# Patient Record
Sex: Male | Born: 1960 | Race: Black or African American | Hispanic: No | Marital: Married | Smoking: Former smoker
Health system: Southern US, Community
[De-identification: ages and names within clinical notes are randomized; demographics above are authoritative.]

## PROBLEM LIST (undated history)

## (undated) DIAGNOSIS — B1920 Unspecified viral hepatitis C without hepatic coma: Secondary | ICD-10-CM

## (undated) DIAGNOSIS — F1010 Alcohol abuse, uncomplicated: Secondary | ICD-10-CM

## (undated) DIAGNOSIS — F1910 Other psychoactive substance abuse, uncomplicated: Secondary | ICD-10-CM

## (undated) HISTORY — DX: Alcohol abuse, uncomplicated: F10.10

## (undated) HISTORY — PX: COLONOSCOPY: SHX174

## (undated) HISTORY — PX: LIVER BIOPSY: SHX301

## (undated) HISTORY — DX: Other psychoactive substance abuse, uncomplicated: F19.10

## (undated) HISTORY — DX: Unspecified viral hepatitis C without hepatic coma: B19.20

---

## 1998-07-15 ENCOUNTER — Emergency Department (HOSPITAL_COMMUNITY): Admission: EM | Admit: 1998-07-15 | Discharge: 1998-07-15 | Payer: Self-pay | Admitting: Emergency Medicine

## 1998-07-15 ENCOUNTER — Encounter: Payer: Self-pay | Admitting: Emergency Medicine

## 2000-03-15 ENCOUNTER — Emergency Department (HOSPITAL_COMMUNITY): Admission: EM | Admit: 2000-03-15 | Discharge: 2000-03-15 | Payer: Self-pay | Admitting: Emergency Medicine

## 2000-03-15 ENCOUNTER — Encounter: Payer: Self-pay | Admitting: Emergency Medicine

## 2000-03-24 ENCOUNTER — Emergency Department (HOSPITAL_COMMUNITY): Admission: EM | Admit: 2000-03-24 | Discharge: 2000-03-24 | Payer: Self-pay | Admitting: Emergency Medicine

## 2003-05-21 ENCOUNTER — Emergency Department (HOSPITAL_COMMUNITY): Admission: EM | Admit: 2003-05-21 | Discharge: 2003-05-21 | Payer: Self-pay | Admitting: Emergency Medicine

## 2004-11-08 ENCOUNTER — Emergency Department (HOSPITAL_COMMUNITY): Admission: EM | Admit: 2004-11-08 | Discharge: 2004-11-08 | Payer: Self-pay | Admitting: Emergency Medicine

## 2004-11-11 ENCOUNTER — Emergency Department (HOSPITAL_COMMUNITY): Admission: EM | Admit: 2004-11-11 | Discharge: 2004-11-11 | Payer: Self-pay | Admitting: Emergency Medicine

## 2004-11-25 ENCOUNTER — Ambulatory Visit: Payer: Self-pay | Admitting: Internal Medicine

## 2004-12-02 ENCOUNTER — Ambulatory Visit: Payer: Self-pay | Admitting: Internal Medicine

## 2004-12-24 ENCOUNTER — Ambulatory Visit: Payer: Self-pay

## 2005-12-14 ENCOUNTER — Ambulatory Visit: Payer: Self-pay | Admitting: Gastroenterology

## 2006-01-17 ENCOUNTER — Encounter (INDEPENDENT_AMBULATORY_CARE_PROVIDER_SITE_OTHER): Payer: Self-pay | Admitting: *Deleted

## 2006-01-17 ENCOUNTER — Ambulatory Visit (HOSPITAL_COMMUNITY): Admission: RE | Admit: 2006-01-17 | Discharge: 2006-01-17 | Payer: Self-pay | Admitting: Gastroenterology

## 2006-01-21 ENCOUNTER — Ambulatory Visit: Payer: Self-pay | Admitting: Gastroenterology

## 2006-02-17 ENCOUNTER — Ambulatory Visit: Payer: Self-pay | Admitting: Gastroenterology

## 2006-09-22 ENCOUNTER — Ambulatory Visit: Payer: Self-pay | Admitting: Gastroenterology

## 2006-09-29 ENCOUNTER — Ambulatory Visit: Payer: Self-pay | Admitting: Gastroenterology

## 2006-10-13 ENCOUNTER — Ambulatory Visit: Payer: Self-pay | Admitting: Gastroenterology

## 2006-11-08 ENCOUNTER — Ambulatory Visit: Payer: Self-pay | Admitting: Gastroenterology

## 2006-12-01 ENCOUNTER — Ambulatory Visit: Payer: Self-pay | Admitting: Gastroenterology

## 2007-01-03 ENCOUNTER — Ambulatory Visit: Payer: Self-pay | Admitting: Gastroenterology

## 2007-01-31 ENCOUNTER — Ambulatory Visit: Payer: Self-pay | Admitting: Gastroenterology

## 2007-03-09 ENCOUNTER — Ambulatory Visit: Payer: Self-pay | Admitting: Gastroenterology

## 2007-04-04 ENCOUNTER — Ambulatory Visit: Payer: Self-pay | Admitting: Gastroenterology

## 2007-05-04 ENCOUNTER — Ambulatory Visit: Payer: Self-pay | Admitting: Gastroenterology

## 2007-09-05 ENCOUNTER — Ambulatory Visit: Payer: Self-pay | Admitting: Gastroenterology

## 2007-09-07 ENCOUNTER — Ambulatory Visit: Payer: Self-pay | Admitting: Gastroenterology

## 2008-07-02 ENCOUNTER — Ambulatory Visit: Payer: Self-pay | Admitting: Gastroenterology

## 2008-10-10 ENCOUNTER — Ambulatory Visit (HOSPITAL_COMMUNITY): Admission: RE | Admit: 2008-10-10 | Discharge: 2008-10-10 | Payer: Self-pay | Admitting: Gastroenterology

## 2008-10-10 ENCOUNTER — Encounter (INDEPENDENT_AMBULATORY_CARE_PROVIDER_SITE_OTHER): Payer: Self-pay | Admitting: Radiology

## 2009-05-30 ENCOUNTER — Encounter: Admission: RE | Admit: 2009-05-30 | Discharge: 2009-05-30 | Payer: Self-pay | Admitting: Internal Medicine

## 2009-11-28 ENCOUNTER — Encounter (INDEPENDENT_AMBULATORY_CARE_PROVIDER_SITE_OTHER): Payer: Self-pay | Admitting: *Deleted

## 2010-10-01 NOTE — Letter (Signed)
Summary: Colonoscopy Letter  [facility]  [address] [place], [place] [address]   Phone: [phone]  Fax: [phone]      November 28, 2009 MRN: [id number]   [facility] [address] [place], [place]  [address]   Dear Mr. MAIDEN,   According to your medical record, it is time for you to schedule a Colonoscopy. The American Cancer Society recommends this procedure as a method to detect early colon cancer. Patients with a family history of colon cancer, or a personal history of colon polyps or inflammatory bowel disease are at increased risk.  This letter has beeen generated based on the recommendations made at the time of your procedure. If you feel that in your particular situation this may no longer apply, please contact our office.  Please call our office at [phone] to schedule this appointment or to update your records at your earliest convenience.  Thank you for cooperating with [place] to provide you with the very best care possible.   Sincerely,  Iva Boop, M.D.  [facility] [phone]

## 2010-12-15 LAB — CBC
HCT: 40.9 % (ref 39.0–52.0)
Hemoglobin: 13.1 g/dL (ref 13.0–17.0)
MCHC: 31.9 g/dL (ref 30.0–36.0)
MCV: 80.1 fL (ref 78.0–100.0)
Platelets: 172 10*3/uL (ref 150–400)
RBC: 5.11 MIL/uL (ref 4.22–5.81)
RDW: 12.8 % (ref 11.5–15.5)
WBC: 6.1 10*3/uL (ref 4.0–10.5)

## 2010-12-15 LAB — PROTIME-INR
INR: 1 (ref 0.00–1.49)
Prothrombin Time: 13.2 seconds (ref 11.6–15.2)

## 2010-12-15 LAB — APTT: aPTT: 33 seconds (ref 24–37)

## 2011-02-02 ENCOUNTER — Other Ambulatory Visit: Payer: Self-pay | Admitting: Internal Medicine

## 2011-02-04 ENCOUNTER — Encounter: Payer: Self-pay | Admitting: Internal Medicine

## 2011-03-15 ENCOUNTER — Other Ambulatory Visit: Payer: Self-pay | Admitting: Internal Medicine

## 2011-04-09 ENCOUNTER — Ambulatory Visit (AMBULATORY_SURGERY_CENTER): Payer: BC Managed Care – PPO | Admitting: *Deleted

## 2011-04-09 ENCOUNTER — Encounter: Payer: Self-pay | Admitting: Internal Medicine

## 2011-04-09 VITALS — Ht 65.0 in | Wt 175.6 lb

## 2011-04-09 DIAGNOSIS — Z1211 Encounter for screening for malignant neoplasm of colon: Secondary | ICD-10-CM

## 2011-04-09 MED ORDER — PEG-KCL-NACL-NASULF-NA ASC-C 100 G PO SOLR: ORAL | Status: DC

## 2011-04-23 ENCOUNTER — Ambulatory Visit (AMBULATORY_SURGERY_CENTER): Payer: BC Managed Care – PPO | Admitting: Internal Medicine

## 2011-04-23 ENCOUNTER — Encounter: Payer: Self-pay | Admitting: Internal Medicine

## 2011-04-23 VITALS — BP 115/85 | HR 67 | Temp 97.0°F | Resp 20 | Ht 65.0 in | Wt 175.0 lb

## 2011-04-23 DIAGNOSIS — Z1211 Encounter for screening for malignant neoplasm of colon: Secondary | ICD-10-CM

## 2011-04-23 DIAGNOSIS — D126 Benign neoplasm of colon, unspecified: Secondary | ICD-10-CM

## 2011-04-23 MED ORDER — SODIUM CHLORIDE 0.9 % IV SOLN: 500.0000 mL | INTRAVENOUS | Status: DC

## 2011-04-23 NOTE — Progress Notes (Signed)
Pressure applied to abdomen to reach cecum.  

## 2011-04-23 NOTE — Patient Instructions (Addendum)
Two small polyps were removed. These should not be a problem and do not look like cancer but could be "pre-cancerous". I will send you a letter to let you know the pathology results and timing of next routine colonoscopy. Iva Boop, MD, [facility]   See green and blue sheets for additional d/c instructions.

## 2011-04-26 ENCOUNTER — Telehealth: Payer: Self-pay | Admitting: *Deleted

## 2011-04-26 NOTE — Telephone Encounter (Signed)
Follow up Call- Patient questions:  Do you have a fever, pain , or abdominal swelling? no Pain Score  0 *  Have you tolerated food without any problems? yes  Have you been able to return to your normal activities? yes  Do you have any questions about your discharge instructions: Diet   no Medications  no Follow up visit  no  Do you have questions or concerns about your Care? no  Actions: * If pain score is 4 or above: No action needed, pain <4. Pt questioned when he would need to return. Explained that once we get path report, dr Leone Payor will decide when he needs to have his next procedure. We will mail him a letter in 1-2 weeks with this information. Pt returned verbal understanding.  EWM

## 2011-04-28 ENCOUNTER — Encounter: Payer: Self-pay | Admitting: Internal Medicine

## 2011-04-28 NOTE — Progress Notes (Signed)
Quick Note:  Two diminutive adenomas Recall colonoscopy 03/2016 ______

## 2014-04-04 ENCOUNTER — Ambulatory Visit (INDEPENDENT_AMBULATORY_CARE_PROVIDER_SITE_OTHER): Payer: BC Managed Care – PPO | Admitting: Physician Assistant

## 2014-04-04 ENCOUNTER — Encounter: Payer: Self-pay | Admitting: Physician Assistant

## 2014-04-04 ENCOUNTER — Other Ambulatory Visit: Payer: Self-pay | Admitting: Physician Assistant

## 2014-04-04 VITALS — BP 110/80 | HR 72 | Temp 98.2°F | Resp 18 | Ht 64.5 in | Wt 172.0 lb

## 2014-04-04 DIAGNOSIS — Z2911 Encounter for prophylactic immunotherapy for respiratory syncytial virus (RSV): Secondary | ICD-10-CM

## 2014-04-04 DIAGNOSIS — B182 Chronic viral hepatitis C: Secondary | ICD-10-CM

## 2014-04-04 DIAGNOSIS — B1920 Unspecified viral hepatitis C without hepatic coma: Secondary | ICD-10-CM

## 2014-04-04 DIAGNOSIS — Z23 Encounter for immunization: Secondary | ICD-10-CM

## 2014-04-04 DIAGNOSIS — Z0000 Encounter for general adult medical examination without abnormal findings: Secondary | ICD-10-CM

## 2014-04-04 LAB — CBC WITH DIFFERENTIAL/PLATELET
BASOS PCT: 0.3 % (ref 0.0–3.0)
Basophils Absolute: 0 10*3/uL (ref 0.0–0.1)
EOS PCT: 0.9 % (ref 0.0–5.0)
Eosinophils Absolute: 0.1 10*3/uL (ref 0.0–0.7)
HEMATOCRIT: 42 % (ref 39.0–52.0)
HEMOGLOBIN: 13.2 g/dL (ref 13.0–17.0)
LYMPHS ABS: 2.7 10*3/uL (ref 0.7–4.0)
LYMPHS PCT: 45.2 % (ref 12.0–46.0)
MCHC: 31.4 g/dL (ref 30.0–36.0)
MCV: 80 fl (ref 78.0–100.0)
MONOS PCT: 9.6 % (ref 3.0–12.0)
Monocytes Absolute: 0.6 10*3/uL (ref 0.1–1.0)
NEUTROS ABS: 2.6 10*3/uL (ref 1.4–7.7)
Neutrophils Relative %: 44 % (ref 43.0–77.0)
Platelets: 204 10*3/uL (ref 150.0–400.0)
RBC: 5.24 Mil/uL (ref 4.22–5.81)
RDW: 12.9 % (ref 11.5–15.5)
WBC: 5.9 10*3/uL (ref 4.0–10.5)

## 2014-04-04 LAB — POCT URINALYSIS DIPSTICK
BILIRUBIN UA: NEGATIVE
GLUCOSE UA: NEGATIVE
Ketones, UA: NEGATIVE
Leukocytes, UA: NEGATIVE
NITRITE UA: NEGATIVE
RBC UA: NEGATIVE
SPEC GRAV UA: 1.02
UROBILINOGEN UA: 1
pH, UA: 6

## 2014-04-04 LAB — BASIC METABOLIC PANEL
BUN: 15 mg/dL (ref 6–23)
CALCIUM: 10.1 mg/dL (ref 8.4–10.5)
CHLORIDE: 106 meq/L (ref 96–112)
CO2: 30 meq/L (ref 19–32)
CREATININE: 0.8 mg/dL (ref 0.4–1.5)
GFR: 123.9 mL/min (ref >60.00)
GLUCOSE: 90 mg/dL (ref 70–99)
Potassium: 5.6 mEq/L — ABNORMAL HIGH (ref 3.5–5.1)
Sodium: 141 mEq/L (ref 135–145)

## 2014-04-04 LAB — HEPATIC FUNCTION PANEL
ALBUMIN: 4.4 g/dL (ref 3.5–5.2)
ALT: 47 U/L (ref 0–53)
AST: 42 U/L — ABNORMAL HIGH (ref 0–37)
Alkaline Phosphatase: 54 U/L (ref 39–117)
Bilirubin, Direct: 0.2 mg/dL (ref 0.0–0.3)
TOTAL PROTEIN: 7.7 g/dL (ref 6.0–8.3)
Total Bilirubin: 1 mg/dL (ref 0.2–1.2)

## 2014-04-04 LAB — LIPID PANEL
Cholesterol: 146 mg/dL (ref 0–200)
HDL: 56.7 mg/dL (ref >39.00)
LDL Cholesterol: 77 mg/dL (ref 0–99)
NonHDL: 89.3
TRIGLYCERIDES: 61 mg/dL (ref 0.0–149.0)
Total CHOL/HDL Ratio: 3
VLDL: 12.2 mg/dL (ref 0.0–40.0)

## 2014-04-04 LAB — PSA: PSA: 1.02 ng/mL (ref 0.10–4.00)

## 2014-04-04 NOTE — Progress Notes (Signed)
Subjective:    Patient ID: Raymond Wells, male    DOB: [birth year], 53 y.o.   MRN: [id number]  HPI Patient presents to clinic today to establish care.  Acute Concerns: Annual Exam  Chronic Issues: Hepatitis C- was in a study at [place]. No longer in the study. Has had Biopsies through [facility]. States his last Staging was Stage 1, no scarring. Requesting referral to specialist.  Health Maintenance: Dental -- Dr. Carlota Wells, just had visit this past Monday. Sees every 3 or 4 months. Vision -- Has prescription glasses, no changes in vision for some time. Immunizations -- Needs tetanus. Requesting Pneumonia and Zostavax. Colonoscopy -- UTD, every 5 years. Next in 2017.     Review of Systems Patient denies fevers, chills, nausea, vomiting, diarrhea, chest pain, shortness of breath, orthopnea, headache, syncope. Denies lower extremity edema, abdominal pain, change in appetite, change in bowel movements. Patient denies rashes, musculoskeletal complaints. No other specific complaints in a complete review of systems.    Past Medical History  Diagnosis Date  . Hepatitis C FOR 25 YEARS    "TREATED BY [phone]"  . Alcohol abuse   . Drug abuse     History   Social History  . Marital Status: Married    Spouse Name: N/A    Number of Children: N/A  . Years of Education: N/A   Occupational History  . Not on file.   Social History Main Topics  . Smoking status: Former Research scientist (life sciences)  . Smokeless tobacco: Never Used  . Alcohol Use: No  . Drug Use: No  . Sexual Activity:    Other Topics Concern  . Not on file   Social History Narrative  . No narrative on file    Past Surgical History  Procedure Laterality Date  . Liver biopsy    . Colonoscopy  2006; 04/22/11    2006: hemorrhoids 2012: two diminutive adenomas    Family History  Problem Relation Age of Onset  . Prostate cancer Maternal Uncle   . Colon cancer Neg Hx   . Alcoholism Mother   . Alcoholism Father     . Hyperlipidemia Father   . Heart disease Father   . Stroke Father   . Hypertension Mother   . Hypertension Father   . Diabetes Maternal Grandmother     No Known Allergies  Current Outpatient Prescriptions on File Prior to Visit  Medication Sig Dispense Refill  . Multiple Vitamin (MULTIVITAMIN) tablet Take 1 tablet by mouth daily.         No current facility-administered medications on file prior to visit.   The PFS history was reviewed with the pt at the time of visit.  EXAM: BP 110/80  Pulse 72  Temp(Src) 98.2 F (36.8 C) (Oral)  Resp 18  Ht 5' 4.5" (1.638 m)  Wt 172 lb (78.019 kg)  BMI 29.08 kg/m2     Objective:   Physical Exam  Nursing note and vitals reviewed. Constitutional: He is oriented to person, place, and time. He appears well-developed and well-nourished. No distress.  HENT:  Head: Normocephalic and atraumatic.  Right Ear: External ear normal.  Left Ear: External ear normal.  Nose: Nose normal.  Mouth/Throat: Oropharynx is clear and moist. No oropharyngeal exudate.  Bilat TMs normal.  Eyes: Conjunctivae and EOM are normal. Pupils are equal, round, and reactive to light. Right eye exhibits no discharge. Left eye exhibits no discharge. No scleral icterus.  Neck: Normal range of motion. Neck supple. No JVD present.  No tracheal deviation present. No thyromegaly present.  Cardiovascular: Normal rate, regular rhythm, normal heart sounds and intact distal pulses.  Exam reveals no gallop and no friction rub.   No murmur heard. Pulmonary/Chest: Effort normal and breath sounds normal. No stridor. No respiratory distress. He has no wheezes. He has no rales. He exhibits no tenderness.  Abdominal: Soft. Bowel sounds are normal. He exhibits no distension and no mass. There is no tenderness. There is no rebound and no guarding.  Liver grossly normal. No palpable masses.  Genitourinary:  Declined.  Musculoskeletal: Normal range of motion. He exhibits no edema and no  tenderness.  Lymphadenopathy:    He has no cervical adenopathy.  Neurological: He is alert and oriented to person, place, and time. He has normal reflexes. No cranial nerve deficit. He exhibits normal muscle tone. Coordination normal.  Skin: Skin is warm and dry. No rash noted. He is not diaphoretic. No erythema. No pallor.  Psychiatric: He has a normal mood and affect. His behavior is normal. Judgment and thought content normal.     Lab Results  Component Value Date   WBC 6.1 10/10/2008   HGB 13.1 10/10/2008   HCT 40.9 10/10/2008   PLT 172 10/10/2008   INR 1.0 10/10/2008        Assessment & Plan:  [name] was seen today for establish care and annual exam.  Diagnoses and associated orders for this visit:  Hepatitis C virus infection without hepatic coma, unspecified chronicity Comments: States he has had biopsies through [facility] and at last check he was Stage 1, no scarring. Will refer to hepatitis C clinic. - AMB referral to hepatitis C clinic  Annual physical exam Comments: Over all healthy. Health maintenance UTD. regular exercise and heart heatlhy diet. - CBC with Differential - Basic Metabolic Panel - Hepatic Function Panel - Lipid Panel - POC Urinalysis Dipstick    Return precautions provided, and patient handout on health maintenance and Hepatitis C.  Plan to follow up as needed for any additional concerns. Follow up in about 1 year for annual physical.  Patient Instructions  We will call with the results of your labwork when available.  You will be called to schedule an appointment to establish with the hepatitis clinic.  Try to get regular exercise at least 30 minutes per day at least 3 days per week. Also try to focus on a heart healthy diet/healthy, varied diet.  Follow up as needed for any additional concerns. Follow up in about 1 year for annual physical.

## 2014-04-04 NOTE — Progress Notes (Signed)
Pre visit review using our clinic review tool, if applicable. No additional management support is needed unless otherwise documented below in the visit note. 

## 2014-04-04 NOTE — Patient Instructions (Addendum)
We will call with the results of your labwork when available.  You will be called to schedule an appointment to establish with the hepatitis clinic.  Try to get regular exercise at least 30 minutes per day at least 3 days per week. Also try to focus on a heart healthy diet/healthy, varied diet.  Follow up as needed for any additional concerns. Follow up in about 1 year for annual physical.    Health Maintenance A healthy lifestyle and preventative care can promote health and wellness.  Maintain regular health, dental, and eye exams.  Eat a healthy diet. Foods like vegetables, fruits, whole grains, low-fat dairy products, and lean protein foods contain the nutrients you need and are low in calories. Decrease your intake of foods high in solid fats, added sugars, and salt. Get information about a proper diet from your health care provider, if necessary.  Regular physical exercise is one of the most important things you can do for your health. Most adults should get at least 150 minutes of moderate-intensity exercise (any activity that increases your heart rate and causes you to sweat) each week. In addition, most adults need muscle-strengthening exercises on 2 or more days a week.   Maintain a healthy weight. The body mass index (BMI) is a screening tool to identify possible weight problems. It provides an estimate of body fat based on height and weight. Your health care provider can find your BMI and can help you achieve or maintain a healthy weight. For males 20 years and older:  A BMI below 18.5 is considered underweight.  A BMI of 18.5 to 24.9 is normal.  A BMI of 25 to 29.9 is considered overweight.  A BMI of 30 and above is considered obese.  Maintain normal blood lipids and cholesterol by exercising and minimizing your intake of saturated fat. Eat a balanced diet with plenty of fruits and vegetables. Blood tests for lipids and cholesterol should begin at age 13 and be repeated  every 5 years. If your lipid or cholesterol levels are high, you are over age 59, or you are at high risk for heart disease, you may need your cholesterol levels checked more frequently.Ongoing high lipid and cholesterol levels should be treated with medicines if diet and exercise are not working.  If you smoke, find out from your health care provider how to quit. If you do not use tobacco, do not start.  Lung cancer screening is recommended for adults aged 15-80 years who are at high risk for developing lung cancer because of a history of smoking. A yearly low-dose CT scan of the lungs is recommended for people who have at least a 30-pack-year history of smoking and are current smokers or have quit within the past 15 years. A pack year of smoking is smoking an average of 1 pack of cigarettes a day for 1 year (for example, a 30-pack-year history of smoking could mean smoking 1 pack a day for 30 years or 2 packs a day for 15 years). Yearly screening should continue until the smoker has stopped smoking for at least 15 years. Yearly screening should be stopped for people who develop a health problem that would prevent them from having lung cancer treatment.  If you choose to drink alcohol, do not have more than 2 drinks per day. One drink is considered to be 12 oz (360 mL) of beer, 5 oz (150 mL) of wine, or 1.5 oz (45 mL) of liquor.  Avoid the use of  street drugs. Do not share needles with anyone. Ask for help if you need support or instructions about stopping the use of drugs.  High blood pressure causes heart disease and increases the risk of stroke. Blood pressure should be checked at least every 1-2 years. Ongoing high blood pressure should be treated with medicines if weight loss and exercise are not effective.  If you are 31-56 years old, ask your health care provider if you should take aspirin to prevent heart disease.  Diabetes screening involves taking a blood sample to check your fasting blood  sugar level. This should be done once every 3 years after age 25 if you are at a normal weight and without risk factors for diabetes. Testing should be considered at a younger age or be carried out more frequently if you are overweight and have at least 1 risk factor for diabetes.  Colorectal cancer can be detected and often prevented. Most routine colorectal cancer screening begins at the age of 23 and continues through age 38. However, your health care provider may recommend screening at an earlier age if you have risk factors for colon cancer. On a yearly basis, your health care provider may provide home test kits to check for hidden blood in the stool. A small camera at the end of a tube may be used to directly examine the colon (sigmoidoscopy or colonoscopy) to detect the earliest forms of colorectal cancer. Talk to your health care provider about this at age 69 when routine screening begins. A direct exam of the colon should be repeated every 5-10 years through age 9, unless early forms of precancerous polyps or small growths are found.  People who are at an increased risk for hepatitis B should be screened for this virus. You are considered at high risk for hepatitis B if:  You were born in a country where hepatitis B occurs often. Talk with your health care provider about which countries are considered high risk.  Your parents were born in a high-risk country and you have not received a shot to protect against hepatitis B (hepatitis B vaccine).  You have HIV or AIDS.  You use needles to inject street drugs.  You live with, or have sex with, someone who has hepatitis B.  You are a man who has sex with other men (MSM).  You get hemodialysis treatment.  You take certain medicines for conditions like cancer, organ transplantation, and autoimmune conditions.  Hepatitis C blood testing is recommended for all people born from [birth year] through 1966 and any individual with known risk factors for  hepatitis C.  Healthy men should no longer receive prostate-specific antigen (PSA) blood tests as part of routine cancer screening. Talk to your health care provider about prostate cancer screening.  Testicular cancer screening is not recommended for adolescents or adult males who have no symptoms. Screening includes self-exam, a health care provider exam, and other screening tests. Consult with your health care provider about any symptoms you have or any concerns you have about testicular cancer.  Practice safe sex. Use condoms and avoid high-risk sexual practices to reduce the spread of sexually transmitted infections (STIs).  You should be screened for STIs, including gonorrhea and chlamydia if:  You are sexually active and are younger than 24 years.  You are older than 24 years, and your health care provider tells you that you are at risk for this type of infection.  Your sexual activity has changed since you were last screened, and  you are at an increased risk for chlamydia or gonorrhea. Ask your health care provider if you are at risk.  If you are at risk of being infected with HIV, it is recommended that you take a prescription medicine daily to prevent HIV infection. This is called pre-exposure prophylaxis (PrEP). You are considered at risk if:  You are a man who has sex with other men (MSM).  You are a heterosexual man who is sexually active with multiple partners.  You take drugs by injection.  You are sexually active with a partner who has HIV.  Talk with your health care provider about whether you are at high risk of being infected with HIV. If you choose to begin PrEP, you should first be tested for HIV. You should then be tested every 3 months for as long as you are taking PrEP.  Use sunscreen. Apply sunscreen liberally and repeatedly throughout the day. You should seek shade when your shadow is shorter than you. Protect yourself by wearing long sleeves, pants, a  wide-brimmed hat, and sunglasses year round whenever you are outdoors.  Tell your health care provider of new moles or changes in moles, especially if there is a change in shape or color. Also, tell your health care provider if a mole is larger than the size of a pencil eraser.  A one-time screening for abdominal aortic aneurysm (AAA) and surgical repair of large AAAs by ultrasound is recommended for men aged 56-75 years who are current or former smokers.  Stay current with your vaccines (immunizations). Document Released: 02/12/2008 Document Revised: 08/21/2013 Document Reviewed: 01/11/2011 [facility] Patient Information 2015 [place], [place]. This information is not intended to replace advice given to you by your health care provider. Make sure you discuss any questions you have with your health care provider. Hepatitis C Hepatitis C is a liver infection. It is caused by a germ called hepatitis C virus (HCV). This germ enters the body through blood and other bodily fluids. HOME CARE  Do not drink alcohol.  Only take medicines as told by your doctor.  Keep all blood test visits as told by your doctor.  Do not have sex until your doctor says it is okay.  Avoid activities that could put other people in contact with your blood. Do not share a toothbrush, nail clippers, razors, or needles.  Follow your doctor's advice to avoid spreading the infection to others. GET HELP RIGHT AWAY IF:  You feel very tired or weak.  You have a temperature by mouth above 102 F (38.9 C), not controlled by medicine.  You do not feel like eating (loss of appetite).  You feel sick to your stomach (nauseous) or throw up (vomit).  Your skin or the whites of your eyes turn yellow (jaundice).  You bruise or bleed easily.  You have any severe problems after treatment. MAKE SURE YOU:  Understand these instructions.  Will watch your condition.  Will get help right away if you are not doing well or get  worse. Document Released: 07/29/2008 Document Revised: 11/08/2011 Document Reviewed: 11/28/2013 [facility] Patient Information 2015 [place], [place]. This information is not intended to replace advice given to you by your health care provider. Make sure you discuss any questions you have with your health care provider.

## 2014-05-16 ENCOUNTER — Other Ambulatory Visit: Payer: BC Managed Care – PPO

## 2014-05-17 ENCOUNTER — Other Ambulatory Visit (INDEPENDENT_AMBULATORY_CARE_PROVIDER_SITE_OTHER): Payer: BC Managed Care – PPO

## 2014-05-17 DIAGNOSIS — B182 Chronic viral hepatitis C: Secondary | ICD-10-CM

## 2014-05-17 LAB — POCT URINALYSIS DIPSTICK
BILIRUBIN UA: NEGATIVE
Glucose, UA: NEGATIVE
KETONES UA: NEGATIVE
Leukocytes, UA: NEGATIVE
Nitrite, UA: NEGATIVE
PH UA: 7
PROTEIN UA: NEGATIVE
RBC UA: NEGATIVE
Spec Grav, UA: 1.02
Urobilinogen, UA: 0.2

## 2014-05-17 LAB — COMPREHENSIVE METABOLIC PANEL
ALBUMIN: 3.9 g/dL (ref 3.5–5.2)
ALK PHOS: 62 U/L (ref 39–117)
ALT: 33 U/L (ref 0–53)
AST: 34 U/L (ref 0–37)
BILIRUBIN TOTAL: 0.8 mg/dL (ref 0.2–1.2)
BUN: 15 mg/dL (ref 6–23)
CO2: 25 meq/L (ref 19–32)
Calcium: 9.3 mg/dL (ref 8.4–10.5)
Chloride: 105 mEq/L (ref 96–112)
Creatinine, Ser: 0.8 mg/dL (ref 0.4–1.5)
GFR: 123.84 mL/min (ref >60.00)
GLUCOSE: 114 mg/dL — AB (ref 70–99)
POTASSIUM: 3.9 meq/L (ref 3.5–5.1)
SODIUM: 138 meq/L (ref 135–145)
TOTAL PROTEIN: 7.1 g/dL (ref 6.0–8.3)

## 2014-09-27 ENCOUNTER — Ambulatory Visit (INDEPENDENT_AMBULATORY_CARE_PROVIDER_SITE_OTHER): Payer: BLUE CROSS/BLUE SHIELD | Admitting: Family Medicine

## 2014-09-27 VITALS — BP 120/80 | HR 77 | Temp 98.7°F | Resp 16 | Ht 64.5 in | Wt 176.0 lb

## 2014-09-27 DIAGNOSIS — Z8619 Personal history of other infectious and parasitic diseases: Secondary | ICD-10-CM

## 2014-09-27 DIAGNOSIS — R319 Hematuria, unspecified: Secondary | ICD-10-CM

## 2014-09-27 DIAGNOSIS — N3943 Post-void dribbling: Secondary | ICD-10-CM

## 2014-09-27 DIAGNOSIS — L98491 Non-pressure chronic ulcer of skin of other sites limited to breakdown of skin: Secondary | ICD-10-CM

## 2014-09-27 LAB — BASIC METABOLIC PANEL
BUN: 13 mg/dL (ref 6–23)
CO2: 28 mEq/L (ref 19–32)
Calcium: 9.8 mg/dL (ref 8.4–10.5)
Chloride: 104 mEq/L (ref 96–112)
Creat: 0.77 mg/dL (ref 0.50–1.35)
Glucose, Bld: 96 mg/dL (ref 70–99)
POTASSIUM: 4.4 meq/L (ref 3.5–5.3)
Sodium: 139 mEq/L (ref 135–145)

## 2014-09-27 LAB — POCT CBC
Granulocyte percent: 36.4 %G — AB (ref 37–80)
HEMATOCRIT: 43 % — AB (ref 43.5–53.7)
HEMOGLOBIN: 13 g/dL — AB (ref 14.1–18.1)
LYMPH, POC: 3.3 (ref 0.6–3.4)
MCH, POC: 24.4 pg — AB (ref 27–31.2)
MCHC: 30.1 g/dL — AB (ref 31.8–35.4)
MCV: 80.8 fL (ref 80–97)
MID (CBC): 0.4 (ref 0–0.9)
MPV: 7.1 fL (ref 0–99.8)
PLATELET COUNT, POC: 201 10*3/uL (ref 142–424)
POC GRANULOCYTE: 2.1 (ref 2–6.9)
POC LYMPH PERCENT: 56.6 %L — AB (ref 10–50)
POC MID %: 7 %M (ref 0–12)
RBC: 5.32 M/uL (ref 4.69–6.13)
RDW, POC: 12.7 %
WBC: 5.9 10*3/uL (ref 4.6–10.2)

## 2014-09-27 LAB — POCT UA - MICROSCOPIC ONLY
BACTERIA, U MICROSCOPIC: NEGATIVE
CRYSTALS, UR, HPF, POC: NEGATIVE
Casts, Ur, LPF, POC: NEGATIVE
EPITHELIAL CELLS, URINE PER MICROSCOPY: NEGATIVE
Mucus, UA: NEGATIVE
RBC, urine, microscopic: NEGATIVE
Yeast, UA: NEGATIVE

## 2014-09-27 LAB — POCT URINALYSIS DIPSTICK
BILIRUBIN UA: NEGATIVE
Glucose, UA: NEGATIVE
KETONES UA: NEGATIVE
NITRITE UA: NEGATIVE
PH UA: 5.5
Protein, UA: NEGATIVE
RBC UA: NEGATIVE
Spec Grav, UA: 1.025
Urobilinogen, UA: 0.2

## 2014-09-27 LAB — IFOBT (OCCULT BLOOD): IMMUNOLOGICAL FECAL OCCULT BLOOD TEST: POSITIVE

## 2014-09-27 MED ORDER — TRIAMCINOLONE ACETONIDE 0.1 % EX CREA: 1.0000 "application " | TOPICAL_CREAM | Freq: Two times a day (BID) | CUTANEOUS | Status: DC

## 2014-09-27 MED ORDER — TAMSULOSIN HCL 0.4 MG PO CAPS: 0.4000 mg | ORAL_CAPSULE | Freq: Every day | ORAL | Status: DC

## 2014-09-27 NOTE — Progress Notes (Signed)
Subjective Patient is going to [facility] for an experimental protocol for his hepatitis C. They called him to tell him that there was blood in his urine specimen from a day or 2 ago. He is come in to get that rechecked. He also complains of cracked piece of skin on the tip of his thumb. It never heals up well for him. He has had urinary dribbling. He says he just case him to shut his stream off well. He says his last PSA was normal.  Objective: No acute distress. Alert and oriented. No CVA tenderness. Abdomen nontender. Normal male external genitalia. Testes descended. No hernias. Penis appears normal. Uncircumcised. Digital rectal exam reveals prostate gland to be small and normal.   Results for orders placed or performed in visit on 09/27/14  POCT UA - Microscopic Only  Result Value Ref Range   WBC, Ur, HPF, POC 5-7    RBC, urine, microscopic neg    Bacteria, U Microscopic neg    Mucus, UA neg    Epithelial cells, urine per micros neg    Crystals, Ur, HPF, POC neg    Casts, Ur, LPF, POC neg    Yeast, UA neg   POCT urinalysis dipstick  Result Value Ref Range   Color, UA bright yellow    Clarity, UA clear    Glucose, UA neg    Bilirubin, UA neg    Ketones, UA neg    Spec Grav, UA 1.025    Blood, UA neg    pH, UA 5.5    Protein, UA neg    Urobilinogen, UA 0.2    Nitrite, UA neg    Leukocytes, UA Trace   POCT CBC  Result Value Ref Range   WBC 5.9 4.6 - 10.2 K/uL   Lymph, poc 3.3 0.6 - 3.4   POC LYMPH PERCENT 56.6 (A) 10 - 50 %L   MID (cbc) 0.4 0 - 0.9   POC MID % 7.0 0 - 12 %M   POC Granulocyte 2.1 2 - 6.9   Granulocyte percent 36.4 (A) 37 - 80 %G   RBC 5.32 4.69 - 6.13 M/uL   Hemoglobin 13.0 (A) 14.1 - 18.1 g/dL   HCT, POC 43.0 (A) 43.5 - 53.7 %   MCV 80.8 80 - 97 fL   MCH, POC 24.4 (A) 27 - 31.2 pg   MCHC 30.1 (A) 31.8 - 35.4 g/dL   RDW, POC 12.7 %   Platelet Count, POC 201 142 - 424 K/uL   MPV 7.1 0 - 99.8 fL  Assessment: Urinary dribbling Hematuria, seems to be  resolved Callus on fingertip  Plan: Triamcinolone cream on the callus Flomax one at bedtime Return in one month to recheck the urine yet again. If he continues with any blood in the urine again will send your urologist. Also if the medicine doesn't help his urinary stream will send them to the urologist. See instructions.

## 2014-09-27 NOTE — Patient Instructions (Addendum)
Take the tamsulosin one pill at bedtime, taking precaution to make sure you do not get lightheaded initially.  If the urinary flow does not improve please return at anytime.  Plan to come back in one month for a recheck of your urine and to see how you are doing with this medication. If you do not improve we'll need to refer you to a urologist.  Use the triamcinolone cream on the callus on the fingertip twice daily.

## 2014-09-28 ENCOUNTER — Encounter: Payer: Self-pay | Admitting: Family Medicine

## 2014-09-28 LAB — PSA: PSA: 2.36 ng/mL (ref <=4.00)

## 2014-10-04 ENCOUNTER — Telehealth: Payer: Self-pay

## 2014-10-04 NOTE — Telephone Encounter (Signed)
Patient would like the results of his labs.  Please Call: [phone]     He is coming in to complete a ROI to have the labs forwarded to another clinic.

## 2014-10-04 NOTE — Telephone Encounter (Signed)
Dr. Linna Darner please review.

## 2014-10-08 NOTE — Telephone Encounter (Signed)
Call: Labs okay except for a chemical trace of blood in his stool.  He should discuss that when he returns next month for his recheck.    Mail him a copy of the labs.  I do not know what happened that they did not get responded to.

## 2014-10-09 ENCOUNTER — Telehealth: Payer: Self-pay

## 2014-10-09 NOTE — Telephone Encounter (Signed)
Raymond Wells from [facility] called requesting records from visit with Dr. Linna Darner in January. States patient signs ROI form already. They need records for Hep C clinical trial with Dr. Lewanda Rife. Cb# [phone]

## 2014-10-09 NOTE — Telephone Encounter (Signed)
[facility] called requesting pt records from visit with Dr. Linna Darner on 1/29 for Hep C clinic.

## 2014-10-09 NOTE — Telephone Encounter (Signed)
Request received and records faxed to [facility] to [facility].

## 2014-11-02 ENCOUNTER — Ambulatory Visit (INDEPENDENT_AMBULATORY_CARE_PROVIDER_SITE_OTHER): Payer: BLUE CROSS/BLUE SHIELD | Admitting: Family Medicine

## 2014-11-02 VITALS — BP 104/68 | HR 71 | Temp 97.4°F | Resp 18 | Ht 64.75 in | Wt 181.4 lb

## 2014-11-02 DIAGNOSIS — N3943 Post-void dribbling: Secondary | ICD-10-CM | POA: Diagnosis not present

## 2014-11-02 DIAGNOSIS — R8281 Pyuria: Secondary | ICD-10-CM

## 2014-11-02 DIAGNOSIS — N390 Urinary tract infection, site not specified: Secondary | ICD-10-CM | POA: Diagnosis not present

## 2014-11-02 DIAGNOSIS — R319 Hematuria, unspecified: Secondary | ICD-10-CM | POA: Diagnosis not present

## 2014-11-02 DIAGNOSIS — L309 Dermatitis, unspecified: Secondary | ICD-10-CM

## 2014-11-02 DIAGNOSIS — R21 Rash and other nonspecific skin eruption: Secondary | ICD-10-CM

## 2014-11-02 LAB — POCT UA - MICROSCOPIC ONLY
CASTS, UR, LPF, POC: NEGATIVE
Crystals, Ur, HPF, POC: NEGATIVE
MUCUS UA: NEGATIVE
RBC, urine, microscopic: NEGATIVE
Yeast, UA: NEGATIVE

## 2014-11-02 LAB — POCT URINALYSIS DIPSTICK
Bilirubin, UA: NEGATIVE
Glucose, UA: NEGATIVE
Ketones, UA: NEGATIVE
Nitrite, UA: NEGATIVE
PROTEIN UA: NEGATIVE
Spec Grav, UA: 1.02
Urobilinogen, UA: 0.2
pH, UA: 5.5

## 2014-11-02 LAB — POCT SKIN KOH: SKIN KOH, POC: NEGATIVE

## 2014-11-02 MED ORDER — TAMSULOSIN HCL 0.4 MG PO CAPS: 0.4000 mg | ORAL_CAPSULE | Freq: Every day | ORAL | Status: DC

## 2014-11-02 MED ORDER — CLOBETASOL PROPIONATE 0.05 % EX OINT: 1.0000 "application " | TOPICAL_OINTMENT | Freq: Two times a day (BID) | CUTANEOUS | Status: DC

## 2014-11-02 MED ORDER — CIPROFLOXACIN HCL 250 MG PO TABS: 250.0000 mg | ORAL_TABLET | Freq: Two times a day (BID) | ORAL | Status: DC

## 2014-11-02 NOTE — Patient Instructions (Addendum)
By over-the-counter Lamisil cream and rub a small amount and both hands every night at bedtime.  Use the clobetasol ointment twice daily on hands  Take the ciprofloxacin one twice daily for infection of urine  Return in 2 weeks for a repeat urinalysis, lab only visit  If hands or not doing better over the next month please return  Continue the tamsulosin

## 2014-11-02 NOTE — Progress Notes (Signed)
Subjective: 54 year old man whom I have seen before. He comes back again, having been at [facility] a few days ago. They called back to say that he had white cells in his urine, 44 per high powered field.  They recommended he get checked for infection. He does not have any urinary symptoms or prostate symptoms no low back or butt pain. No extramarital sex. No risk of STD known. He is being treated for hepatitis C, and apparently is responding well with almost undetectable viral load at this time. Ectasia trouble with his hands. He has thick dry skin on both hands. 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 I tried triamcinolone cream on this previously. He says he uses lots of creams and lotions on his hands without relief.  Objective: No acute distress. No CVA tenderness. Normal male external genitalia with no penile lesions hands have thick dry skin with flaking along the creases primarily  Assessment: Dry skin on hands, rule out fungus Pyuria, rule out UTI  Plan: Recheck urine Skin scraping  Results for orders placed or performed in visit on 11/02/14  POCT urinalysis dipstick  Result Value Ref Range   Color, UA yellow    Clarity, UA clear    Glucose, UA neg    Bilirubin, UA neg    Ketones, UA neg    Spec Grav, UA 1.020    Blood, UA trace-intact    pH, UA 5.5    Protein, UA neg    Urobilinogen, UA 0.2    Nitrite, UA neg    Leukocytes, UA small (1+)   POCT UA - Microscopic Only  Result Value Ref Range   WBC, Ur, HPF, POC 3-6    RBC, urine, microscopic neg    Bacteria, U Microscopic 3+    Mucus, UA neg    Epithelial cells, urine per micros 0-1    Crystals, Ur, HPF, POC neg    Casts, Ur, LPF, POC neg    Yeast, UA neg   POCT Skin KOH  Result Value Ref Range   Skin KOH, POC Negative     Continue the Flomax  Although there is no fungus seen, I'm going to have him use Lamisil along with using  clobetasol ointment. If not improving will either need to do a punch biopsy of his hand or send him on to a dermatologist.  Take ciprofloxacin one twice daily for his urinary infection  Return in 2 weeks for lab only visit to check the urine

## 2014-11-05 LAB — URINE CULTURE: Colony Count: >=100000

## 2015-03-11 ENCOUNTER — Ambulatory Visit (INDEPENDENT_AMBULATORY_CARE_PROVIDER_SITE_OTHER): Payer: 59 | Admitting: Family Medicine

## 2015-03-11 VITALS — BP 108/74 | HR 72 | Temp 98.2°F | Resp 18 | Ht 65.5 in | Wt 178.4 lb

## 2015-03-11 DIAGNOSIS — R972 Elevated prostate specific antigen [PSA]: Secondary | ICD-10-CM

## 2015-03-11 DIAGNOSIS — Z8619 Personal history of other infectious and parasitic diseases: Secondary | ICD-10-CM

## 2015-03-11 DIAGNOSIS — M25511 Pain in right shoulder: Secondary | ICD-10-CM

## 2015-03-11 DIAGNOSIS — R39198 Other difficulties with micturition: Secondary | ICD-10-CM

## 2015-03-11 DIAGNOSIS — Z0000 Encounter for general adult medical examination without abnormal findings: Secondary | ICD-10-CM

## 2015-03-11 DIAGNOSIS — R3919 Other difficulties with micturition: Secondary | ICD-10-CM | POA: Diagnosis not present

## 2015-03-11 LAB — COMPLETE METABOLIC PANEL WITH GFR
ALK PHOS: 66 U/L (ref 39–117)
ALT: 28 U/L (ref 0–53)
AST: 27 U/L (ref 0–37)
Albumin: 4.8 g/dL (ref 3.5–5.2)
BILIRUBIN TOTAL: 0.8 mg/dL (ref 0.2–1.2)
BUN: 15 mg/dL (ref 6–23)
CALCIUM: 10.3 mg/dL (ref 8.4–10.5)
CHLORIDE: 100 meq/L (ref 96–112)
CO2: 27 meq/L (ref 19–32)
CREATININE: 0.77 mg/dL (ref 0.50–1.35)
Glucose, Bld: 81 mg/dL (ref 70–99)
Potassium: 4.3 mEq/L (ref 3.5–5.3)
Sodium: 141 mEq/L (ref 135–145)
Total Protein: 8.2 g/dL (ref 6.0–8.3)

## 2015-03-11 LAB — POCT CBC
Granulocyte percent: 37.5 %G (ref 37–80)
HCT, POC: 46.7 % (ref 43.5–53.7)
Hemoglobin: 14.5 g/dL (ref 14.1–18.1)
Lymph, poc: 4.1 — AB (ref 0.6–3.4)
MCH, POC: 24 pg — AB (ref 27–31.2)
MCHC: 31.1 g/dL — AB (ref 31.8–35.4)
MCV: 77.2 fL — AB (ref 80–97)
MID (cbc): 0.4 (ref 0–0.9)
MPV: 6.5 fL (ref 0–99.8)
POC Granulocyte: 2.7 (ref 2–6.9)
POC LYMPH %: 56.7 % — AB (ref 10–50)
POC MID %: 5.8 % (ref 0–12)
Platelet Count, POC: 224 10*3/uL (ref 142–424)
RBC: 6.05 M/uL (ref 4.69–6.13)
RDW, POC: 13.1 %
WBC: 7.2 10*3/uL (ref 4.6–10.2)

## 2015-03-11 LAB — IFOBT (OCCULT BLOOD): IFOBT: NEGATIVE

## 2015-03-11 NOTE — Progress Notes (Addendum)
Physical exam:  History:: 54 year old man previously known to me who is here for his annual physical. He had no major acute complaints, it was just time to come in.  Past medical history: Major illnesses: He had a problem with substance abuse in the past. It is been 11 years since he has had trouble with this. He still continues to attend AA. No major medical illnesses except for the history of hepatitis C which he has had for 25 years and it is not an active disease. He has been followed by a study at [facility] for this. Surgeries: None Regular medications: Flomax Allergies: None  Family history: Both parents are deceased. Father died of consequences of stroke. Mother had an infected toe that went on to cause gangrene in caliber. One brother has diabetes. Sister is healthy. He has one son.  Social history: Patient is married, that is his only sexual partner. 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 He exercises at the gym regularly. Watches television. He does not smoke, drink, or use any substances. He attends [employer].  Review of systems: Constitutional: Unremarkable HEENT: Wears glasses intermittently, otherwise unremarkable Cardiovascular: Unremarkable Respiratory: Unremarkable GI: Unremarkable GU: Nocturia 3 Musculoskeletal: Has some shoulder pain which she is going to come back and addressed at a future visit Dermatologic: Unremarkable Allergy/immunology: Unremarkable Neurological: Unremarkable Hematologic: Unremarkable Psychiatric: Unremarkable   Physical exam: Healthy-appearing gentleman in no major distress. TMs dull. Eyes PERRLA. Throat clear. Neck supple without nodes or thyromegaly. No carotid bruits. Chest is clear to all station. Heart regular without murmurs. And soft without mass or tenderness. Normal male external genitalia, uncircumcised, testes descended. No hernias. Digital rectal exam  reveals prostate gland to be normal in contour and size. Extremities without edema. Skin normal. Has a couple of tattoos, is unstable on his left arm and his old prison a month his right hand([place]).  Assessment: Normal physical examination Remote history of substance abuse History of decreased urinary flow, on Flomax. History of PSA rise 5 months ago.  Plan: Continue taking the tamsulosin one daily  Continue getting regular exercise  Return annually or as needed  I'll in the results of your labs, including the repeated PSA. It had gone from 1 to 2 and I want to make sure that it is not increasing.  Return as discussed to reassess shoulder

## 2015-03-11 NOTE — Patient Instructions (Signed)
Continue taking the tamsulosin one daily  Continue getting regular exercise  Return annually or as needed  I'll in the results of your labs, including the repeated PSA. It had gone from 1 to 2 and I want to make sure that it is not increasing.  Return as discussed to reassess shoulder

## 2015-03-12 ENCOUNTER — Telehealth: Payer: Self-pay | Admitting: Family Medicine

## 2015-03-12 LAB — PSA: PSA: 0.83 ng/mL (ref <=4.00)

## 2015-03-12 NOTE — Telephone Encounter (Signed)
Patient calling about labs

## 2015-03-13 ENCOUNTER — Encounter: Payer: Self-pay | Admitting: Family Medicine

## 2015-03-13 NOTE — Telephone Encounter (Signed)
Lab letter was already in the mail.

## 2015-03-20 ENCOUNTER — Telehealth: Payer: Self-pay

## 2015-03-20 NOTE — Telephone Encounter (Signed)
Pt is needing lab results

## 2015-03-20 NOTE — Telephone Encounter (Signed)
Pt is calling requesting lab results from 03/11/15. Please review and comment on lab results.

## 2015-03-21 NOTE — Telephone Encounter (Signed)
Call: Labs good.  Please mail him a copy

## 2015-03-22 ENCOUNTER — Ambulatory Visit (INDEPENDENT_AMBULATORY_CARE_PROVIDER_SITE_OTHER): Payer: 59

## 2015-03-22 ENCOUNTER — Ambulatory Visit (INDEPENDENT_AMBULATORY_CARE_PROVIDER_SITE_OTHER): Payer: 59 | Admitting: Family Medicine

## 2015-03-22 VITALS — BP 100/70 | HR 75 | Temp 97.7°F | Resp 16 | Ht 65.5 in | Wt 182.4 lb

## 2015-03-22 DIAGNOSIS — M25511 Pain in right shoulder: Secondary | ICD-10-CM

## 2015-03-22 DIAGNOSIS — B352 Tinea manuum: Secondary | ICD-10-CM

## 2015-03-22 DIAGNOSIS — R21 Rash and other nonspecific skin eruption: Secondary | ICD-10-CM | POA: Diagnosis not present

## 2015-03-22 DIAGNOSIS — M7551 Bursitis of right shoulder: Secondary | ICD-10-CM

## 2015-03-22 LAB — POCT SKIN KOH: Skin KOH, POC: POSITIVE

## 2015-03-22 MED ORDER — TERBINAFINE HCL 250 MG PO TABS: 250.0000 mg | ORAL_TABLET | Freq: Every day | ORAL | Status: DC

## 2015-03-22 MED ORDER — DICLOFENAC SODIUM 75 MG PO TBEC: 75.0000 mg | DELAYED_RELEASE_TABLET | Freq: Two times a day (BID) | ORAL | Status: DC

## 2015-03-22 MED ORDER — HYDROCODONE-ACETAMINOPHEN 7.5-325 MG PO TABS: 1.0000 | ORAL_TABLET | ORAL | Status: DC | PRN

## 2015-03-22 MED ORDER — CLOBETASOL PROPIONATE 0.05 % EX CREA: 1.0000 "application " | TOPICAL_CREAM | Freq: Two times a day (BID) | CUTANEOUS | Status: DC

## 2015-03-22 MED ORDER — TRIAMCINOLONE ACETONIDE 40 MG/ML IJ SUSP
40.0000 mg | Freq: Once | INTRAMUSCULAR | Status: AC
  Administered 2015-03-22: 40 mg via INTRAMUSCULAR

## 2015-03-22 NOTE — Progress Notes (Signed)
  Subjective:  Patient ID: Raymond Wells, male    DOB: 1961-08-11  Age: 54 y.o. MRN: [id number]  Patient continues to have a lot of pain in his right shoulder. He's been having it for about 6 months but over the last couple of weeks got steadily worse. No specific injury but pulling cables probably caused it to have an initially. He does workout in the gym regularly but has not been able to due to the pain  [name].   Objective:   Skin scraping done of right palm.  He has a very tender shoulder just anterior to the acromion. Also tender down in the biceps region. Hurts with medial motions. Hurts to raise against gravity.   [facility] reading (PRIMARY) by  Dr. Linna Darner Normal shoulder xray  Results for orders placed or performed in visit on 03/22/15  POCT Skin KOH  Result Value Ref Range   Skin KOH, POC Positive    . Procedure note: Discussed treatment options and he chose to go ahead with the injection here. Sterile technique was used. Anterior approach to the subacromial area. Chloride used to anesthetize. 1 mL of Depo-Medrol 40 and 2 mL of 2% lidocaine were injected. Patient tolerated the procedure satisfactorily. Is still tender.  Assessment & Plan:   Assessment: Subacromial bursitis right shoulder Tinea manus   Plan: Patient Instructions  Apply ice to shoulder for about 15 or 20 minutes every 3 or 4 hours for the next couple of days  Gently do range of motion exercises of the shoulder  Wait about a week before resuming more significant gym activities   Take the hydrocodone pain pills every 4 hours if needed for pain tonight and tomorrow. I would hope that over the next 24 hours he started getting some relief shoulder discomfort  Take the diclofenac and inflammatory pain reliever one twice daily for the next 2 weeks if it does not cause any stomach upset. Take with breakfast and supper  After you finish taking the diclofenac, take the benefit and  250 mg one daily for 2 weeks for the rash on your hands  Use the clobetasol cream twice daily on rash on hands  If you're not doing better with either of the above issues please return     Raymond Bennett, MD 03/22/2015

## 2015-03-22 NOTE — Patient Instructions (Signed)
Apply ice to shoulder for about 15 or 20 minutes every 3 or 4 hours for the next couple of days  Gently do range of motion exercises of the shoulder  Wait about a week before resuming more significant gym activities   Take the hydrocodone pain pills every 4 hours if needed for pain tonight and tomorrow. I would hope that over the next 24 hours he started getting some relief shoulder discomfort  Take the diclofenac and inflammatory pain reliever one twice daily for the next 2 weeks if it does not cause any stomach upset. Take with breakfast and supper  After you finish taking the diclofenac, take the benefit and 250 mg one daily for 2 weeks for the rash on your hands  Use the clobetasol cream twice daily on rash on hands  If you're not doing better with either of the above issues please return

## 2015-03-24 NOTE — Telephone Encounter (Signed)
Spoke with pt to make sure he was informed of labs. He said he spoke to Dr. Linna Darner.

## 2015-03-31 ENCOUNTER — Ambulatory Visit: Payer: 59 | Admitting: Physician Assistant

## 2015-11-28 ENCOUNTER — Ambulatory Visit (INDEPENDENT_AMBULATORY_CARE_PROVIDER_SITE_OTHER): Payer: 59 | Admitting: Family Medicine

## 2015-11-28 VITALS — BP 116/78 | HR 70 | Temp 97.6°F | Resp 18 | Ht 65.5 in | Wt 183.2 lb

## 2015-11-28 DIAGNOSIS — S46811A Strain of other muscles, fascia and tendons at shoulder and upper arm level, right arm, initial encounter: Secondary | ICD-10-CM

## 2015-11-28 DIAGNOSIS — N3943 Post-void dribbling: Secondary | ICD-10-CM | POA: Diagnosis not present

## 2015-11-28 MED ORDER — CYCLOBENZAPRINE HCL 5 MG PO TABS: 5.0000 mg | ORAL_TABLET | Freq: Three times a day (TID) | ORAL | Status: DC | PRN

## 2015-11-28 MED ORDER — TRAMADOL HCL 50 MG PO TABS: 50.0000 mg | ORAL_TABLET | Freq: Three times a day (TID) | ORAL | Status: DC | PRN

## 2015-11-28 MED ORDER — TAMSULOSIN HCL 0.4 MG PO CAPS: 0.4000 mg | ORAL_CAPSULE | Freq: Every day | ORAL | Status: AC

## 2015-11-28 MED ORDER — DICLOFENAC SODIUM 75 MG PO TBEC: 75.0000 mg | DELAYED_RELEASE_TABLET | Freq: Two times a day (BID) | ORAL | Status: DC

## 2015-11-28 NOTE — Progress Notes (Signed)
Patient ID: Raymond Wells, male    DOB: May 23, 1961  Age: 55 y.o. MRN: [id number]  Chief Complaint  Patient presents with  . Neck Injury    Happened a few weeks ago. Pain became unbearable last Saturday    Subjective:   55 year old man who works out at [employer]. He did some butterflies that strain his right shoulder and neck couple of weeks ago. Is gradually got hurt hurting worse. He has slacked off from the gym this week. 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 He generally is healthy man. He has had more problems with his prostate and nocturia, and would like to be back on the Flomax which he is out of.  Current allergies, medications, problem list, past/family and social histories reviewed.  Objective:  BP 116/78 mmHg  Pulse 70  Temp(Src) 97.6 F (36.4 C) (Oral)  Resp 18  Ht 5' 5.5" (1.664 m)  Wt 183 lb 3.2 oz (83.099 kg)  BMI 30.01 kg/m2  SpO2 98%  No acute distress. Tender in the right trapezius. Stretching of the trapezius with left lateral tilt of the neck and flexion, extension, and rotation causes some pain in the trapezius. The left arm has good strength. Fair range of motion of shoulder. He is tender to touch across the top of the right trapezius.  Did not repeat the prostate exam today. His PSA last summer was good.  Assessment & Plan:   Assessment: 1. Urinary dribbling   2. Trapezius strain, right, initial encounter       Plan: Resume the tamsulosin  Treat the shoulder with ice, heat, muscle relaxant, anti-inflammatory medication. Also gave some additional pain pills. Return if not improving in the couple of weeks.  No orders of the defined types were placed in this encounter.    Meds ordered this encounter  Medications  . tamsulosin (FLOMAX) 0.4 MG CAPS capsule    Sig: Take 1 capsule (0.4 mg total) by mouth daily.    Dispense:  30 capsule    Refill:  5  . diclofenac (VOLTAREN) 75 MG EC tablet    Sig: Take 1 tablet (75 mg  total) by mouth 2 (two) times daily.    Dispense:  30 tablet    Refill:  0  . cyclobenzaprine (FLEXERIL) 5 MG tablet    Sig: Take 1 tablet (5 mg total) by mouth 3 (three) times daily as needed for muscle spasms.    Dispense:  30 tablet    Refill:  1  . traMADol (ULTRAM) 50 MG tablet    Sig: Take 1 tablet (50 mg total) by mouth every 8 (eight) hours as needed.    Dispense:  30 tablet    Refill:  0         Patient Instructions   Take diclofenac one twice daily for pain and inflammation at breakfast and supper (do not take ibuprofen or Aleve while taking this)  For worse pain take tramadol 1 every 6 or 8 hours  At least 3 times daily alternate ice and heat for about 15 or 20 minutes  Avoid heavy lifting or straining or working out with the upper body for the next couple of weeks  Take the cyclobenzaprine muscle relaxant 5 mg in the morning and 10 mg (2 5 milligrams) at bedtime  If not much better in the next 2 weeks please return. You might need some physical therapy.  Continue taking the tamsulosin for your prostate(Flomax). If your symptoms  get worse we will refer you to a urologist.    IF you received an x-ray today, you will receive an invoice from [facility]. Please contact [facility] at [phone] with questions or concerns regarding your invoice.   IF you received labwork today, you will receive an invoice from [employer]. Please contact [facility] at [phone] with questions or concerns regarding your invoice.   Our billing staff will not be able to assist you with questions regarding bills from these companies.  You will be contacted with the lab results as soon as they are available. The fastest way to get your results is to activate your My Chart account. Instructions are located on the last page of this paperwork. If you have not heard from [place] regarding the results in 2 weeks, please contact this office.           No Follow-up on file.   Student Kray, MD 11/28/2015

## 2015-11-28 NOTE — Patient Instructions (Addendum)
Take diclofenac one twice daily for pain and inflammation at breakfast and supper (do not take ibuprofen or Aleve while taking this)  For worse pain take tramadol 1 every 6 or 8 hours  At least 3 times daily alternate ice and heat for about 15 or 20 minutes  Avoid heavy lifting or straining or working out with the upper body for the next couple of weeks  Take the cyclobenzaprine muscle relaxant 5 mg in the morning and 10 mg (2 5 milligrams) at bedtime  If not much better in the next 2 weeks please return. You might need some physical therapy.  Continue taking the tamsulosin for your prostate(Flomax). If your symptoms get worse we will refer you to a urologist.    IF you received an x-ray today, you will receive an invoice from [facility] Rad[facility]. Please contact [facility] at [phone] with questions or concerns regarding your invoice.   IF you received labwork today, you will receive an invoice from [employer]. Please contact [facility] at [phone] with questions or concerns regarding your invoice.   Our billing staff will not be able to assist you with questions regarding bills from these companies.  You will be contacted with the lab results as soon as they are available. The fastest way to get your results is to activate your My Chart account. Instructions are located on the last page of this paperwork. If you have not heard from [place] regarding the results in 2 weeks, please contact this office.

## 2015-12-18 ENCOUNTER — Ambulatory Visit (INDEPENDENT_AMBULATORY_CARE_PROVIDER_SITE_OTHER): Payer: BLUE CROSS/BLUE SHIELD | Admitting: Physician Assistant

## 2015-12-18 ENCOUNTER — Ambulatory Visit (INDEPENDENT_AMBULATORY_CARE_PROVIDER_SITE_OTHER): Payer: BLUE CROSS/BLUE SHIELD

## 2015-12-18 VITALS — BP 114/80 | HR 90 | Temp 97.8°F | Resp 18 | Ht 65.5 in | Wt 178.8 lb

## 2015-12-18 DIAGNOSIS — M5030 Other cervical disc degeneration, unspecified cervical region: Secondary | ICD-10-CM

## 2015-12-18 DIAGNOSIS — S161XXD Strain of muscle, fascia and tendon at neck level, subsequent encounter: Secondary | ICD-10-CM | POA: Diagnosis not present

## 2015-12-18 DIAGNOSIS — M542 Cervicalgia: Secondary | ICD-10-CM

## 2015-12-18 MED ORDER — CYCLOBENZAPRINE HCL 10 MG PO TABS
10.0000 mg | ORAL_TABLET | Freq: Every day | ORAL | Status: DC
Start: 2015-12-18 — End: 2016-04-29

## 2015-12-18 MED ORDER — MELOXICAM 15 MG PO TABS: 15.0000 mg | ORAL_TABLET | Freq: Every day | ORAL | Status: DC

## 2015-12-18 NOTE — Progress Notes (Signed)
[facility] [address] [place] [address] [phone]  Date:  12/18/2015   Name:  Raymond Wells   DOB:  1960-10-25   MRN:  [id number]  PCP:  Ruben Reason, MD    Chief Complaint: Neck Pain   History of Present Illness:  This is a 55 y.o. male with PMH Hep C who is presenting for follow up neck pain. He was seen here 11/28/15 by Dr. Linna Darner. At that time pain had been present for 2 weeks ever since doing shoulder fly exercises at the gym. He was diagnosed with a right trapezius strain. He was diagnosed tramadol, voltaren gel and flexeril.   He is here today stating that his pain has not gotten any better. Having pain in back of neck, states feels like "someone is beating me in the back of the head". The right side of his neck is very tender. Moving his neck from side to side is hard d/t pain. Denies numbness or shooting pain into arms. Finished all of the tabs that were prescribed and nothing helped. Tried ice and heat off and on, not helping. He states flexeril was initially helping him sleep but stopped helping.  Review of Systems:  Review of Systems See HPI  Patient Active Problem List   Diagnosis Date Noted  . Hepatitis C 04/04/2014    Prior to Admission medications   Medication Sig Start Date End Date Taking? Authorizing Provider  Multiple Vitamin (MULTIVITAMIN) tablet Take 1 tablet by mouth daily.     Yes Historical Provider, MD  tamsulosin (FLOMAX) 0.4 MG CAPS capsule Take 1 capsule (0.4 mg total) by mouth daily. 11/28/15  Yes Posey Boyer, MD                                       No Known Allergies  Past Surgical History  Procedure Laterality Date  . Liver biopsy    . Colonoscopy  2006; 04/22/11    2006: hemorrhoids 2012: two diminutive adenomas    Social History  Substance Use Topics  . Smoking status: Former Research scientist (life sciences)  . Smokeless tobacco: Never Used  . Alcohol Use: No    Family History  Problem Relation Age of Onset  . Prostate  cancer Maternal Uncle   . Colon cancer Neg Hx   . Alcoholism Mother   . Alcoholism Father   . Hyperlipidemia Father   . Heart disease Father   . Stroke Father   . Hypertension Mother   . Hypertension Father   . Diabetes Maternal Grandmother     Medication list has been reviewed and updated.  Physical Examination:  Physical Exam  Constitutional: He is oriented to person, place, and time. He appears well-developed and well-nourished. No distress.  HENT:  Head: Normocephalic and atraumatic.  Right Ear: Hearing normal.  Left Ear: Hearing normal.  Nose: Nose normal.  Eyes: Conjunctivae and lids are normal. Right eye exhibits no discharge. Left eye exhibits no discharge. No scleral icterus.  Pulmonary/Chest: Effort normal. No respiratory distress.  Musculoskeletal:       Cervical back: He exhibits decreased range of motion (esp neck extension, right rotation, bilateral lateral flexion), tenderness (base of occiput and right lateral neck) and bony tenderness (base of occiput). He exhibits no swelling.  Mild tenderness right trapezius Shoulder ROM normal UE strength normal Sensation intact spurlings negative  Neurological: He is alert and oriented  to person, place, and time.  Skin: Skin is warm, dry and intact. No lesion and no rash noted.  Psychiatric: He has a normal mood and affect. His speech is normal and behavior is normal. Thought content normal.   BP 114/80 mmHg  Pulse 90  Temp(Src) 97.8 F (36.6 C) (Oral)  Resp 18  Ht 5' 5.5" (1.664 m)  Wt 178 lb 12.8 oz (81.103 kg)  BMI 29.29 kg/m2  SpO2 96%  Dg Cervical Spine Complete  12/18/2015  CLINICAL DATA:  Five weeks of neck pain, follow-up study, persistent symptoms tenderness to palpation over the right aspect of the neck. EXAM: CERVICAL SPINE - COMPLETE 4+ VIEW COMPARISON:  None in PACs FINDINGS: The cervical vertebral bodies are preserved in height. There is moderate disc space narrowing at C5-6. There are anterior near  bridging osteophytes at C4-5, C5-6, and C6-7. There is no perched facet or spinous process fracture. There is bony encroachment upon the C5-6 and C6-7 neural foramina bilaterally. IMPRESSION: There is degenerative disc disease centered at C5-6 with milder changes at C4-5 and C6-7. There is bony encroachment upon the neural foramina bilaterally in the lower cervical spine. Given the patient's symptoms, cervical spine MRI would be useful for further evaluation of the cervical nerve roots in the cervical spinal canal. Electronically Signed   By: David  Martinique M.D.   On: 12/18/2015 16:48   Assessment and Plan:  1. Neck strain, subsequent encounter 2. Neck pain 3. DDD, cervical Neck pain still seems muscular. He is stiff on ROM and has pain at base of occiput and lateral right neck. No radicular symptoms. Radiograph did show DDD at lower cervical spine with possible encroachment on neural foramina bilaterally. I think this is likely an incidental finding rather than the cause of his pain. Radiologist recommended an MRI to further evaluate. We discussed his options and he opted for PT first. He will do at least 2 weeks of PT and if symptoms are not improving at all, I will order MRI. Gave higher dose flexeril to take at bedtime. Take mobic during the day. Counseled on heat, gentle massage and gentle stretching. - meloxicam (MOBIC) 15 MG tablet; Take 1 tablet (15 mg total) by mouth daily.  Dispense: 30 tablet; Refill: 0 - cyclobenzaprine (FLEXERIL) 10 MG tablet; Take 1 tablet (10 mg total) by mouth at bedtime.  Dispense: 30 tablet; Refill: 0 - Ambulatory referral to Physical Therapy - DG Cervical Spine Complete; Future   Benjaman Pott. Drenda Freeze, MHS [facility]  12/18/2015

## 2015-12-18 NOTE — Patient Instructions (Addendum)
Use mobic daily. Do not use any other products with this containing ibuprofen, naprosyn or aspirin. You MAY use tylenol with this. Flexeril at night to help with sleep. Heat, gentle massage and gentle stretching can help. Remain active, as inactivity can cause more pain. Don't do anything too strenuous though. If you develop numbness or weakness in your arms, return ASAP. You will get a phone call to make appt with PT.  If your symptoms are not improving after 2 weeks of PT, let me know and I will place order for MRI.    IF you received an x-ray today, you will receive an invoice from [facility] Rad[facility]. Please contact [facility] Radiology at [phone] with questions or concerns regarding your invoice.   IF you received labwork today, you will receive an invoice from [employer]. Please contact [facility] at [phone] with questions or concerns regarding your invoice.   Our billing staff will not be able to assist you with questions regarding bills from these companies.  You will be contacted with the lab results as soon as they are available. The fastest way to get your results is to activate your My Chart account. Instructions are located on the last page of this paperwork. If you have not heard from [place] regarding the results in 2 weeks, please contact this office.

## 2016-04-09 ENCOUNTER — Encounter: Payer: Self-pay | Admitting: Internal Medicine

## 2016-04-29 ENCOUNTER — Encounter: Payer: Self-pay | Admitting: Internal Medicine

## 2016-04-29 ENCOUNTER — Ambulatory Visit (AMBULATORY_SURGERY_CENTER): Payer: Self-pay

## 2016-04-29 VITALS — Ht 65.0 in | Wt 175.8 lb

## 2016-04-29 DIAGNOSIS — Z860100 Personal history of colon polyps, unspecified: Secondary | ICD-10-CM

## 2016-04-29 MED ORDER — SUPREP BOWEL PREP KIT 17.5-3.13-1.6 GM/177ML PO SOLN: 1.0000 | Freq: Once | ORAL | 0 refills | Status: AC

## 2016-04-29 NOTE — Progress Notes (Signed)
No allergies to eggs or soy No past problems with anesthesia No diet meds No home oxygen  Declined emmi 

## 2016-05-10 ENCOUNTER — Encounter: Payer: Self-pay | Admitting: Internal Medicine

## 2016-05-13 ENCOUNTER — Encounter: Payer: Self-pay | Admitting: Internal Medicine

## 2016-05-13 ENCOUNTER — Ambulatory Visit (AMBULATORY_SURGERY_CENTER): Payer: BLUE CROSS/BLUE SHIELD | Admitting: Internal Medicine

## 2016-05-13 VITALS — BP 98/63 | HR 84 | Temp 96.8°F | Resp 19 | Ht 65.0 in | Wt 175.0 lb

## 2016-05-13 DIAGNOSIS — Z86010 Personal history of colonic polyps: Secondary | ICD-10-CM

## 2016-05-13 MED ORDER — SODIUM CHLORIDE 0.9 % IV SOLN: 500.0000 mL | INTRAVENOUS | Status: AC

## 2016-05-13 NOTE — Progress Notes (Signed)
TO PACU  Pt awake and alert. Report to RN 

## 2016-05-13 NOTE — Op Note (Signed)
[facility] Patient Name: Raymond Wells Procedure Date: 05/13/2016 1:31 PM MRN: [id number] Endoscopist: Gatha Mayer , MD Age: 56 Referring MD:  Date of Birth: November 26, 1960 Gender: Male Account #: [id number] Procedure:                Colonoscopy Indications:              High risk colon cancer surveillance: Personal                            history of colonic polyps, Last colonoscopy: 2012 Medicines:                Propofol per Anesthesia, Monitored Anesthesia Care Procedure:                Pre-Anesthesia Assessment:                           - Prior to the procedure, a History and Physical                            was performed, and patient medications and                            allergies were reviewed. The patient's tolerance of                            previous anesthesia was also reviewed. The risks                            and benefits of the procedure and the sedation                            options and risks were discussed with the patient.                            All questions were answered, and informed consent                            was obtained. Prior Anticoagulants: The patient has                            taken no previous anticoagulant or antiplatelet                            agents. ASA Grade Assessment: II - A patient with                            mild systemic disease. After reviewing the risks                            and benefits, the patient was deemed in                            satisfactory condition to undergo the procedure.  After obtaining informed consent, the colonoscope                            was passed under direct vision. Throughout the                            procedure, the patient's blood pressure, pulse, and                            oxygen saturations were monitored continuously. The                            Model CF-[id number]) scope was introduced           through the anus and advanced to the the cecum,                            identified by appendiceal orifice and ileocecal                            valve. The quality of the bowel preparation was                            excellent. The colonoscopy was performed without                            difficulty. The patient tolerated the procedure                            well. The bowel preparation used was Miralax. The                            ileocecal valve, appendiceal orifice, and rectum                            were photographed. Scope In: 1:37:44 PM Scope Out: 1:46:59 PM Scope Withdrawal Time: 0 hours 7 minutes 51 seconds  Total Procedure Duration: 0 hours 9 minutes 15 seconds  Findings:                 The perianal and digital rectal examinations were                            normal. Pertinent negatives include normal prostate                            (size, shape, and consistency).                           The colon (entire examined portion) appeared normal.                           No additional abnormalities were found on                            retroflexion. Complications:  No immediate complications. Estimated blood loss:                            None. Estimated Blood Loss:     Estimated blood loss: none. Impression:               - The entire examined colon is normal.                           - No specimens collected.                           - Personal history of colonic polyps. 2 diminutive                            adenomas 2012 Recommendation:           - Repeat colonoscopy in 10 years for screening                            purposes.                           - Patient has a contact number available for                            emergencies. The signs and symptoms of potential                            delayed complications were discussed with the                            patient. Return to normal activities tomorrow.                             Written discharge instructions were provided to the                            patient.                           - Resume previous diet.                           - Continue present medications.                           - Patient has a contact number available for                            emergencies. The signs and symptoms of potential                            delayed complications were discussed with the                            patient. Return to normal activities tomorrow.  Written discharge instructions were provided to the                            patient. Gatha Mayer, MD 05/13/2016 1:56:40 PM This report has been signed electronically.

## 2016-05-13 NOTE — Patient Instructions (Addendum)
   No polyps today!  Next routine colonoscopy in 10 years - 2027  I appreciate the opportunity to care for you. Gatha Mayer, MD, FACG  YOU HAD AN ENDOSCOPIC PROCEDURE TODAY AT [facility]:   Refer to the procedure report that was given to you for any specific questions about what was found during the examination.  If the procedure report does not answer your questions, please call your gastroenterologist to clarify.  If you requested that your care partner not be given the details of your procedure findings, then the procedure report has been included in a sealed envelope for you to review at your convenience later.  YOU SHOULD EXPECT: Some feelings of bloating in the abdomen. Passage of more gas than usual.  Walking can help get rid of the air that was put into your GI tract during the procedure and reduce the bloating. If you had a lower endoscopy (such as a colonoscopy or flexible sigmoidoscopy) you may notice spotting of blood in your stool or on the toilet paper. If you underwent a bowel prep for your procedure, you may not have a normal bowel movement for a few days.  Please Note:  You might notice some irritation and congestion in your nose or some drainage.  This is from the oxygen used during your procedure.  There is no need for concern and it should clear up in a day or so.  SYMPTOMS TO REPORT IMMEDIATELY:   Following lower endoscopy (colonoscopy or flexible sigmoidoscopy):  Excessive amounts of blood in the stool  Significant tenderness or worsening of abdominal pains  Swelling of the abdomen that is new, acute  Fever of 100F or higher   For urgent or emergent issues, a gastroenterologist can be reached at any hour by calling [phone].   DIET:  We do recommend a small meal at first, but then you may proceed to your regular diet.  Drink plenty of fluids but you should avoid alcoholic beverages for 24 hours.  ACTIVITY:  You should plan to take  it easy for the rest of today and you should NOT DRIVE or use heavy machinery until tomorrow (because of the sedation medicines used during the test).    FOLLOW UP: Our staff will call the number listed on your records the next business day following your procedure to check on you and address any questions or concerns that you may have regarding the information given to you following your procedure. If we do not reach you, we will leave a message.  However, if you are feeling well and you are not experiencing any problems, there is no need to return our call.  We will assume that you have returned to your regular daily activities without incident.  If any biopsies were taken you will be contacted by phone or by letter within the next 1-3 weeks.  Please call us at [phone] if you have not heard about the biopsies in 3 weeks.    SIGNATURES/CONFIDENTIALITY: You and/or your care partner have signed paperwork which will be entered into your electronic medical record.  These signatures attest to the fact that that the information above on your After Visit Summary has been reviewed and is understood.  Full responsibility of the confidentiality of this discharge information lies with you and/or your care-partner.

## 2016-05-14 ENCOUNTER — Telehealth: Payer: Self-pay

## 2016-05-14 NOTE — Telephone Encounter (Signed)
  Follow up Call-  Call back number 05/13/2016  Post procedure Call Back phone  # [phone]  Permission to leave phone message Yes  Some recent data might be hidden    Patient was called for follow up after his procedure on 05/13/2016. No answer at the number given for follow up phone call. A message was left on the answering machine.

## 2017-03-30 DEATH — deceased

## 2017-06-08 ENCOUNTER — Other Ambulatory Visit (HOSPITAL_BASED_OUTPATIENT_CLINIC_OR_DEPARTMENT_OTHER): Payer: Self-pay

## 2017-06-08 DIAGNOSIS — R0683 Snoring: Secondary | ICD-10-CM

## 2017-06-29 ENCOUNTER — Ambulatory Visit (HOSPITAL_BASED_OUTPATIENT_CLINIC_OR_DEPARTMENT_OTHER): Payer: BLUE CROSS/BLUE SHIELD | Attending: Urology | Admitting: Internal Medicine

## 2017-06-29 ENCOUNTER — Encounter (HOSPITAL_BASED_OUTPATIENT_CLINIC_OR_DEPARTMENT_OTHER): Payer: BLUE CROSS/BLUE SHIELD

## 2017-06-29 DIAGNOSIS — G4736 Sleep related hypoventilation in conditions classified elsewhere: Secondary | ICD-10-CM | POA: Insufficient documentation

## 2017-06-29 DIAGNOSIS — R0683 Snoring: Secondary | ICD-10-CM

## 2017-06-29 DIAGNOSIS — G4733 Obstructive sleep apnea (adult) (pediatric): Secondary | ICD-10-CM | POA: Insufficient documentation

## 2017-07-01 DIAGNOSIS — G4733 Obstructive sleep apnea (adult) (pediatric): Secondary | ICD-10-CM | POA: Insufficient documentation

## 2017-07-03 DIAGNOSIS — R0683 Snoring: Secondary | ICD-10-CM | POA: Diagnosis not present

## 2017-07-03 NOTE — Procedures (Signed)
Patient Name: Raymond Wells, Raymond Wells Date: 06/29/2017 Gender: Male D.O.B: 01-09-1961 Age (years): 56 Referring Provider: Irine Seal Height (inches): 41 Interpreting Physician: Baird Lyons MD, ABSM Weight (lbs): 190 RPSGT: Jonna Coup BMI: 32 MRN: [id number] Neck Size: 16.50 CLINICAL INFORMATION Sleep Study Type: HST  Indication for sleep study: Snoring  Epworth Sleepiness Score: 4  SLEEP STUDY TECHNIQUE A multi-channel overnight portable sleep study was performed. The channels recorded were: nasal airflow, thoracic respiratory movement, and oxygen saturation with a pulse oximetry. Snoring was also monitored.  MEDICATIONS Patient self administered medications include: none reported.  SLEEP ARCHITECTURE Patient was studied for 437.5 minutes. The sleep efficiency was 98.4 % and the patient was supine for 76.9%. The arousal index was 0.0 per hour.  RESPIRATORY PARAMETERS The overall AHI was 20.7 per hour, with a central apnea index of 0.0 per hour.  The oxygen nadir was 80% during sleep.  CARDIAC DATA Mean heart rate during sleep was 73.6 bpm.  IMPRESSIONS - Moderate obstructive sleep apnea occurred during this study (AHI = 20.7/h). - No significant central sleep apnea occurred during this study (CAI = 0.0/h). - Oxygen desaturation was noted during this study (Min O2 = 80%, Mean 91%).  DIAGNOSIS - Obstructive Sleep Apnea (327.23 [G47.33 ICD-10]) - Nocturnal Hypoxemia (327.26 [G47.36 ICD-10])  RECOMMENDATIONS - Recommend CPAPA titration or autoPAP  Other options would be based on clinical judgment. - Avoid alcohol, sedatives and other CNS depressants that may worsen sleep apnea and disrupt normal sleep architecture. - Sleep hygiene should be reviewed to assess factors that may improve sleep quality. - Weight management and regular exercise should be initiated or continued.  [Electronically signed] 07/03/2017 08:44 AM  Baird Lyons MD, [place], American  Board of Sleep Medicine   NPI: [id number]

## 2019-02-05 ENCOUNTER — Other Ambulatory Visit: Payer: Self-pay | Admitting: *Deleted

## 2019-02-05 DIAGNOSIS — Z20822 Contact with and (suspected) exposure to covid-19: Secondary | ICD-10-CM

## 2019-02-08 LAB — NOVEL CORONAVIRUS, NAA: SARS-CoV-2, NAA: NOT DETECTED

## 2019-04-04 ENCOUNTER — Emergency Department (HOSPITAL_COMMUNITY): Payer: BLUE CROSS/BLUE SHIELD

## 2019-04-04 ENCOUNTER — Other Ambulatory Visit: Payer: Self-pay

## 2019-04-04 ENCOUNTER — Emergency Department (HOSPITAL_COMMUNITY)
Admission: EM | Admit: 2019-04-04 | Discharge: 2019-04-04 | Disposition: A | Discharge status: Home / Self Care | Payer: BLUE CROSS/BLUE SHIELD | Attending: Emergency Medicine | Admitting: Emergency Medicine

## 2019-04-04 ENCOUNTER — Encounter (HOSPITAL_COMMUNITY): Payer: Self-pay | Admitting: Emergency Medicine

## 2019-04-04 DIAGNOSIS — R319 Hematuria, unspecified: Secondary | ICD-10-CM | POA: Diagnosis not present

## 2019-04-04 DIAGNOSIS — Z79899 Other long term (current) drug therapy: Secondary | ICD-10-CM | POA: Insufficient documentation

## 2019-04-04 DIAGNOSIS — Z87891 Personal history of nicotine dependence: Secondary | ICD-10-CM | POA: Diagnosis not present

## 2019-04-04 DIAGNOSIS — R109 Unspecified abdominal pain: Secondary | ICD-10-CM

## 2019-04-04 DIAGNOSIS — R1031 Right lower quadrant pain: Secondary | ICD-10-CM | POA: Diagnosis present

## 2019-04-04 LAB — BASIC METABOLIC PANEL
Anion gap: 5 (ref 5–15)
BUN: 11 mg/dL (ref 6–20)
CO2: 25 mmol/L (ref 22–32)
Calcium: 9 mg/dL (ref 8.9–10.3)
Chloride: 108 mmol/L (ref 98–111)
Creatinine, Ser: 0.87 mg/dL (ref 0.61–1.24)
GFR calc Af Amer: >60 mL/min (ref >60)
GFR calc non Af Amer: >60 mL/min (ref >60)
Glucose, Bld: 109 mg/dL — ABNORMAL HIGH (ref 70–99)
Potassium: 4.1 mmol/L (ref 3.5–5.1)
Sodium: 138 mmol/L (ref 135–145)

## 2019-04-04 LAB — URINALYSIS, ROUTINE W REFLEX MICROSCOPIC
Bilirubin Urine: NEGATIVE
Glucose, UA: NEGATIVE mg/dL
Ketones, ur: NEGATIVE mg/dL
Leukocytes,Ua: NEGATIVE
Nitrite: NEGATIVE
Protein, ur: NEGATIVE mg/dL
RBC / HPF: >50 RBC/hpf — ABNORMAL HIGH (ref 0–5)
Specific Gravity, Urine: 1.021 (ref 1.005–1.030)
pH: 5 (ref 5.0–8.0)

## 2019-04-04 LAB — CBC
HCT: 42.3 % (ref 39.0–52.0)
Hemoglobin: 12.9 g/dL — ABNORMAL LOW (ref 13.0–17.0)
MCH: 25 pg — ABNORMAL LOW (ref 26.0–34.0)
MCHC: 30.5 g/dL (ref 30.0–36.0)
MCV: 82 fL (ref 80.0–100.0)
Platelets: 162 10*3/uL (ref 150–400)
RBC: 5.16 MIL/uL (ref 4.22–5.81)
RDW: 12 % (ref 11.5–15.5)
WBC: 6.9 10*3/uL (ref 4.0–10.5)
nRBC: 0 % (ref 0.0–0.2)

## 2019-04-04 MED ORDER — KETOROLAC TROMETHAMINE 30 MG/ML IJ SOLN
15.0000 mg | Freq: Once | INTRAMUSCULAR | Status: AC
  Administered 2019-04-04: 15 mg via INTRAVENOUS
  Filled 2019-04-04: qty 1

## 2019-04-04 MED ORDER — CEPHALEXIN 500 MG PO CAPS: 500.0000 mg | ORAL_CAPSULE | Freq: Two times a day (BID) | ORAL | 0 refills | Status: AC

## 2019-04-04 MED ORDER — SODIUM CHLORIDE 0.9 % IV BOLUS
1000.0000 mL | Freq: Once | INTRAVENOUS | Status: AC
  Administered 2019-04-04: 1000 mL via INTRAVENOUS

## 2019-04-04 NOTE — ED Provider Notes (Signed)
[facility] DEPT Provider Note   CSN: [id number] Arrival date & time: 04/04/19  0906    History   Chief Complaint Chief Complaint  Patient presents with   Flank Pain    HPI Colston Pyle is a 58 y.o. male with PMHx alcohol abuse and hepatitis C who presents to the ED today complaining of sudden onset, intermittent, right flank pain radiating into inguinal area that began last night. Pt reports that the pain started last night but dissipated after about 30-40 minutes without any medication. Pt woke up this morning and felt fine and went to work when the pain returned worse than before. He also endorses nausea this AM with the pain. Pt reports the pain has mildly subsided while being in the ED but he is concerned it will return. He has not taken anything for the pain. No hx kidney stones in the past. Denies fever, chills, abdominal pain, vomiting, change in urinary sx (pt takes flomax for BPH and states he typically urinates more frequently), testicular pain or swelling, penile discharge, or any other associated symptoms. Pt does endorse drinking a lot of coffee without much water intake.        Past Medical History:  Diagnosis Date   Alcohol abuse    Drug abuse ([place])    Hepatitis C FOR 25 YEARS   "TREATED BY [phone]"    Patient Active Problem List   Diagnosis Date Noted   OSA (obstructive sleep apnea) 07/01/2017   Hepatitis C 04/04/2014    Past Surgical History:  Procedure Laterality Date   COLONOSCOPY  2006; 04/22/11, 05/13/2016   2006: hemorrhoids 2012: two diminutive adenomas; 2017 negative   LIVER BIOPSY          Home Medications    Prior to Admission medications   Medication Sig Start Date End Date Taking? Authorizing Provider  Multiple Vitamin (MULTIVITAMIN) tablet Take 1 tablet by mouth 3 (three) times a week.    Yes [provider]  tamsulosin (FLOMAX) 0.4 MG CAPS capsule Take 1 capsule (0.4 mg  total) by mouth daily. Patient taking differently: Take 0.4 mg by mouth every evening.  11/28/15  Yes Posey Boyer, MD  cephALEXin (KEFLEX) 500 MG capsule Take 1 capsule (500 mg total) by mouth 2 (two) times daily for 7 days. 04/04/19 04/11/19  Eustaquio Maize, PA-C    Family History Family History  Problem Relation Age of Onset   Prostate cancer Maternal Uncle    Cancer Maternal Uncle    Alcoholism Mother    Hypertension Mother    Alcoholism Father    Hyperlipidemia Father    Heart disease Father    Stroke Father    Hypertension Father    Diabetes Maternal Grandmother    Cancer Maternal Aunt    Cancer Maternal Uncle    Cancer Maternal Aunt    Cancer Maternal Uncle    Cancer Maternal Uncle    Colon cancer Neg Hx     Social History Social History   Tobacco Use   Smoking status: Former Smoker   Smokeless tobacco: Never Used  Substance Use Topics   Alcohol use: No   Drug use: No     Allergies   Codeine   Review of Systems Review of Systems  Constitutional: Negative for chills and fever.  HENT: Negative for congestion.   Eyes: Negative for visual disturbance.  Respiratory: Negative for shortness of breath.   Cardiovascular: Negative for chest pain.  Gastrointestinal: Positive for nausea.  Negative for abdominal pain, constipation, diarrhea and vomiting.  Genitourinary: Positive for flank pain. Negative for difficulty urinating and dysuria.  Musculoskeletal: Negative for myalgias.  Skin: Negative for rash.  Neurological: Negative for headaches.     Physical Exam Updated Vital Signs BP 103/72 (BP Location: Right Arm)    Pulse 73    Temp 98.1 F (36.7 C) (Oral)    Ht 5\' 5"  (1.651 m)    Wt 80.7 kg    SpO2 98%    BMI 29.62 kg/m   Physical Exam Vitals signs and nursing note reviewed.  Constitutional:      Appearance: He is not ill-appearing.  HENT:     Head: Normocephalic and atraumatic.  Eyes:     Conjunctiva/sclera: Conjunctivae normal.    Neck:     Musculoskeletal: Neck supple.  Cardiovascular:     Rate and Rhythm: Normal rate and regular rhythm.     Pulses: Normal pulses.  Pulmonary:     Effort: Pulmonary effort is normal.     Breath sounds: Normal breath sounds. No wheezing, rhonchi or rales.  Abdominal:     Palpations: Abdomen is soft.     Tenderness: There is no abdominal tenderness. There is right CVA tenderness. There is no left CVA tenderness, guarding or rebound.  Musculoskeletal:     Right lower leg: No edema.     Left lower leg: No edema.  Skin:    General: Skin is warm and dry.  Neurological:     Mental Status: He is alert.      ED Treatments / Results  Labs (all labs ordered are listed, but only abnormal results are displayed) Labs Reviewed  BASIC METABOLIC PANEL - Abnormal; Notable for the following components:      Result Value   Glucose, Bld 109 (*)    All other components within normal limits  CBC - Abnormal; Notable for the following components:   Hemoglobin 12.9 (*)    MCH 25.0 (*)    All other components within normal limits  URINALYSIS, ROUTINE W REFLEX MICROSCOPIC - Abnormal; Notable for the following components:   APPearance HAZY (*)    Hgb urine dipstick LARGE (*)    RBC / HPF >50 (*)    Bacteria, UA RARE (*)    All other components within normal limits  URINE CULTURE    EKG None  Radiology Ct Renal Stone Study  Result Date: 04/04/2019 CLINICAL DATA:  Right flank and back pain. EXAM: CT ABDOMEN AND PELVIS WITHOUT CONTRAST TECHNIQUE: Multidetector CT imaging of the abdomen and pelvis was performed following the standard protocol without IV contrast. COMPARISON:  None. FINDINGS: Lower chest: No acute abnormality. Hepatobiliary: No focal liver abnormality is seen. No gallstones, gallbladder wall thickening, or biliary dilatation. Pancreas: Unremarkable. No pancreatic ductal dilatation or surrounding inflammatory changes. Spleen: Normal in size without focal abnormality.  Adrenals/Urinary Tract: Adrenal glands are unremarkable. Kidneys are normal, without renal calculi, focal lesion, or hydronephrosis. Bladder is unremarkable. Stomach/Bowel: Stomach is within normal limits. No evidence of appendicitis. No evidence of bowel wall thickening, distention, or inflammatory changes. Vascular/Lymphatic: Aortic atherosclerosis, mild. No enlarged abdominal or pelvic lymph nodes. Small fat containing right inguinal hernia. Reproductive: Prostate is unremarkable. Other: None. Musculoskeletal: No acute or significant osseous findings. Spondylosis of the lumbosacral spine. IMPRESSION: 1. No evidence of acute abnormality within the abdomen or pelvis. No nephrolithiasis or obstructive uropathy. 2. Mild aortic atherosclerosis. 3. Small fat containing right inguinal hernia. Aortic Atherosclerosis (ICD10-I70.0). Electronically Signed  By: Fidela Salisbury M.D.   On: 04/04/2019 10:44    Procedures Procedures (including critical care time)  Medications Ordered in ED Medications  sodium chloride 0.9 % bolus 1,000 mL (0 mLs Intravenous Stopped 04/04/19 1201)  ketorolac (TORADOL) 30 MG/ML injection 15 mg (15 mg Intravenous Given 04/04/19 1201)     Initial Impression / Assessment and Plan / ED Course  I have reviewed the triage vital signs and the nursing notes.  Pertinent labs & imaging results that were available during my care of the patient were reviewed by me and considered in my medical decision making (see chart for details).  Clinical Course as of Apr 03 1502  Wed Apr 04, 2019  1032 Hemoglobin(!): 12.9 [MV]    Clinical Course User Index [MV] Eustaquio Maize, [place]   Pt is a 58 year old male who presents to the ED with intermittent right flank pain radiating into right groin x 1 day with nausea. He has right CVA Tenderness on exam. No abdominal tenderness to suggest acute abdomen today. No change in urinary sx - pt takes flomax for BPH and endorses urinating a lot with the  medicine. Pt afebrile in the ED without tachycardia or tachypnea. Very strong suspicion for kidney stone today given colicky pain intermittently. Will obtain baseline bloodwork as well as CT renal stone study today. IVFs given as well. Will hold off on toradol until kidney function returns. Pt has allergy to codeine; will avoid at this time.   CT scan negative for any acute process today. CBC without leukocytosis. Hgb stable at 12.9 although minimally decreased from previous. No complaints of melena or hematochezia. Do not feel he needs additional work up. No electrolyte abnormalities. U/A with large hgb and > 50 RBCs per high power field despite no signs of kidney stones. NO obvious findings in the bladder on CT scan. Discussed with attending physician Dr. Rogene Houston who suggests urine culture and treating for potential UTI. Pt to follow up with urologist; it appears he is already seen by [facility] for BPH. Advised to call them and let them know about his ED visit today. He is in agreement with plan at this time and stable for discharge home.        Final Clinical Impressions(s) / ED Diagnoses   Final diagnoses:  Right flank pain  Hematuria, unspecified type    ED Discharge Orders         Ordered    cephALEXin (KEFLEX) 500 MG capsule  2 times daily     04/04/19 1258           Eustaquio Maize, PA-C 04/04/19 1505    Fredia Sorrow, MD 04/06/19 1600

## 2019-04-04 NOTE — ED Triage Notes (Signed)
Pt BIBA from work.    Per EMS- Pt c/o back pain starting last night, got better, then when at work became worse- radiates from right flank into groin.    Pt ambulatory on scene.   136/86 76 HR 96% O2 on RA

## 2019-04-04 NOTE — ED Notes (Signed)
Patient transported to CT 

## 2019-04-05 LAB — URINE CULTURE: Culture: <10000 — AB

## 2019-04-06 ENCOUNTER — Other Ambulatory Visit: Payer: Self-pay

## 2019-04-06 DIAGNOSIS — Z20822 Contact with and (suspected) exposure to covid-19: Secondary | ICD-10-CM

## 2019-04-07 LAB — NOVEL CORONAVIRUS, NAA: SARS-CoV-2, NAA: NOT DETECTED

## 2019-08-14 ENCOUNTER — Emergency Department (HOSPITAL_COMMUNITY)
Admission: EM | Admit: 2019-08-14 | Discharge: 2019-08-14 | Disposition: A | Discharge status: Home / Self Care | Payer: BC Managed Care – PPO | Attending: Emergency Medicine | Admitting: Emergency Medicine

## 2019-08-14 ENCOUNTER — Encounter (HOSPITAL_COMMUNITY): Payer: Self-pay | Admitting: *Deleted

## 2019-08-14 DIAGNOSIS — Z79899 Other long term (current) drug therapy: Secondary | ICD-10-CM | POA: Insufficient documentation

## 2019-08-14 DIAGNOSIS — Z87891 Personal history of nicotine dependence: Secondary | ICD-10-CM | POA: Insufficient documentation

## 2019-08-14 DIAGNOSIS — U071 COVID-19: Secondary | ICD-10-CM | POA: Diagnosis not present

## 2019-08-14 DIAGNOSIS — M25561 Pain in right knee: Secondary | ICD-10-CM | POA: Insufficient documentation

## 2019-08-14 DIAGNOSIS — M25562 Pain in left knee: Secondary | ICD-10-CM | POA: Diagnosis not present

## 2019-08-14 MED ORDER — METHOCARBAMOL 500 MG PO TABS
750.0000 mg | ORAL_TABLET | Freq: Once | ORAL | Status: AC
  Administered 2019-08-14: 18:00:00 750 mg via ORAL
  Filled 2019-08-14: qty 2

## 2019-08-14 MED ORDER — METHOCARBAMOL 500 MG PO TABS: 500.0000 mg | ORAL_TABLET | Freq: Two times a day (BID) | ORAL | 0 refills | Status: AC

## 2019-08-14 MED ORDER — IBUPROFEN 200 MG PO TABS
400.0000 mg | ORAL_TABLET | Freq: Once | ORAL | Status: AC
  Administered 2019-08-14: 400 mg via ORAL
  Filled 2019-08-14: qty 2

## 2019-08-14 NOTE — ED Triage Notes (Addendum)
Pt complains of fever, body aches, weakness. Pt tested positive for COVID 19 and the flu on 12/7. Pt was given 1000mg  tylenol en route.   Temp 103.3 BP 120/77 HR 103 RR 18 O2 95% on RA CBG 145

## 2019-08-14 NOTE — Discharge Instructions (Addendum)
Please return to the ED if you have any new or concerning symptoms.  With your recent Covid diagnosis please continue to follow CDC guidelines and wear a mask and wash your hands and quarantine.  Please return to ED if you have any severe shortness of breath which is shortness of breath at rest.  Please rest ice and elevate your knees.  Take Tylenol and ibuprofen as described below.  Please use Tylenol or ibuprofen for pain.  You may use 600 mg ibuprofen every 6 hours or 1000 mg of Tylenol every 6 hours.  You may choose to alternate between the 2.  This would be most effective.  Not to exceed 4 g of Tylenol within 24 hours.  Not to exceed 3200 mg ibuprofen 24 hours.

## 2019-08-14 NOTE — ED Provider Notes (Addendum)
[facility] DEPT Provider Note   CSN: [id number] Arrival date & time: 08/14/19  1419     History Chief Complaint  Patient presents with   Leg Pain   COVID    Raymond Wells is a 58 y.o. male   HPI  Patient he was recently tested Covid positive and flu positive presents today with fevers, general body aches, bilateral knee pain and generalized backaches.  Patient states that he has had this symptoms for the past week but that his knee pain worsened today.  Patient states he has history of popping in his knees but denies any formal diagnosis of osteo arthritis.  States he has taken no medications at all for his symptoms other than Mucinex.  States he is still congested, coughing, fatigued and feeling rundown with his flu Covid.  Patient denies any redness, pain with motion, swelling or tenderness pushing on his knees.  Patient states he is able to ambulate comfortably and in fact the pain feels somewhat improved with this.   Patient was given 1 g of Tylenol by EMS in route and states that he is significantly improved his general symptoms with this.  Patient has any shortness of breath, chest pain, increased work of breathing, lightheadedness or dizziness.     Past Medical History:  Diagnosis Date   Alcohol abuse    Drug abuse ([place])    Hepatitis C FOR 25 YEARS   "TREATED BY [phone]"    Patient Active Problem List   Diagnosis Date Noted   OSA (obstructive sleep apnea) 07/01/2017   Hepatitis C 04/04/2014    Past Surgical History:  Procedure Laterality Date   COLONOSCOPY  2006; 04/22/11, 05/13/2016   2006: hemorrhoids 2012: two diminutive adenomas; 2017 negative   LIVER BIOPSY         Family History  Problem Relation Age of Onset   Prostate cancer Maternal Uncle    Cancer Maternal Uncle    Alcoholism Mother    Hypertension Mother    Alcoholism Father    Hyperlipidemia Father    Heart disease Father     Stroke Father    Hypertension Father    Diabetes Maternal Grandmother    Cancer Maternal Aunt    Cancer Maternal Uncle    Cancer Maternal Aunt    Cancer Maternal Uncle    Cancer Maternal Uncle    Colon cancer Neg Hx     Social History   Tobacco Use   Smoking status: Former Smoker   Smokeless tobacco: Never Used  Substance Use Topics   Alcohol use: No   Drug use: No    Home Medications Prior to Admission medications   Medication Sig Start Date End Date Taking? Authorizing Provider  methocarbamol (ROBAXIN) 500 MG tablet Take 1 tablet (500 mg total) by mouth 2 (two) times daily. 08/14/19   Tedd Sias, PA  Multiple Vitamin (MULTIVITAMIN) tablet Take 1 tablet by mouth 3 (three) times a week.     [provider]  tamsulosin (FLOMAX) 0.4 MG CAPS capsule Take 1 capsule (0.4 mg total) by mouth daily. Patient taking differently: Take 0.4 mg by mouth every evening.  11/28/15   Posey Boyer, MD    Allergies    Codeine  Review of Systems   Review of Systems  Constitutional: Positive for chills and fever.  HENT: Positive for congestion and rhinorrhea.   Respiratory: Positive for cough. Negative for shortness of breath.   Cardiovascular: Negative for chest pain.  Gastrointestinal: Negative for abdominal pain.  Musculoskeletal: Negative for neck pain.       Back pain, bilateral knee pain, generalized body aches  Skin: Negative for rash.  Neurological: Negative for dizziness, light-headedness and headaches.    Physical Exam Updated Vital Signs BP 126/85 (BP Location: Left Arm)    Pulse (!) 102    Temp 100 F (37.8 C) (Oral)    Resp 18    SpO2 93%   Physical Exam Vitals and nursing note reviewed.  Constitutional:      General: He is not in acute distress.    Appearance: Normal appearance. He is not ill-appearing.  HENT:     Head: Normocephalic and atraumatic.     Mouth/Throat:     Mouth: Mucous membranes are moist.  Eyes:     General: No scleral  icterus.       Right eye: No discharge.        Left eye: No discharge.     Conjunctiva/sclera: Conjunctivae normal.  Cardiovascular:     Rate and Rhythm: Normal rate and regular rhythm.     Pulses: Normal pulses.     Heart sounds: Normal heart sounds. No murmur.     Comments: Pulse 92 on my exam  Palpable 3+ bilateral DP PT pulses. Pulmonary:     Effort: Pulmonary effort is normal. No respiratory distress.     Breath sounds: Normal breath sounds. No stridor. No wheezing.  Musculoskeletal:     Comments: Strength intact bilateral lower extremities.  Some crepitus with range of motion of bilateral knees.  No pain with full range of motion of knees.  No significant tenderness to palpation of the knee joints.  No erythema or swelling of any joints.  Negative balloon ballottement bilateral  Skin:    General: Skin is warm and dry.     Capillary Refill: Capillary refill takes less than 2 seconds.     Comments: No erythema, induration, evidence of cellulitis, fluctuance, bruising, rash or pallor  Neurological:     Mental Status: He is alert and oriented to person, place, and time. Mental status is at baseline.     Comments: Strength 5/5 flexion extension of knee, flexion-extension of ankle.  Sensation grossly intact bilateral lower extremities.  Patellar reflexes intact.  Able to ambulate without pain     ED Results / Procedures / Treatments   Labs (all labs ordered are listed, but only abnormal results are displayed) Labs Reviewed - No data to display  EKG None  Radiology No results found.  Procedures Procedures (including critical care time)  Medications Ordered in ED Medications  methocarbamol (ROBAXIN) tablet 750 mg (has no administration in time range)  ibuprofen (ADVIL) tablet 400 mg (has no administration in time range)    ED Course  I have reviewed the triage vital signs and the nursing notes.  Pertinent labs & imaging results that were available during my  care of the patient were reviewed by me and considered in my medical decision making (see chart for details).    MDM Rules/Calculators/A&P     Patient is 58 year old male with history of hepatitis C with very remote drug use no recent use.  Denies any alcohol or drug use in the past several years.  Presented today with bilateral knee pain and generalized achiness and body pain with fever which has improved since he was given Tylenol in route by EMS.  Patient has no history of trauma to indicate fracture.  No indication for  x-rays at this time.  Suspect that his bilateral knee pain today is due to body aches associated with his fever.  As patient has not taken any Tylenol or ibuprofen today and improved with 1 dose of Tylenol that he received in route I suspect that this is associated with his recent diagnosis of flu and Covid.  Doubt septic joint as patient has no pain with range of motion.  Doubt fracture as mentioned above.  Doubt DVT as patient has no history of clots, no recent surgery, no calf swelling or unilateral swelling and pain is predominantly in his knee.  Doubt electrolyte abnormality as cause of leg pain as patient pain is predominantly in the joint does night any cramps or spasms.  Doubt arterial occlusion as patient has good pulses good cap refill   And warm extremities.  We will discharge patient with recommendations use Tylenol.  Will give muscle relaxers patient has some generalized muscle aches.   Patient given return precautions.  Patient understanding of plan this time.    This patient appears reasonably screened and I doubt any other medical condition requiring further workup, evaluation, or treatment in the ED at this time prior to discharge.   Patient's vitals are WNL apart from vital sign abnormalities discussed above, patient is in NAD, and able to ambulate in the ED at their baseline. Pain has been managed or a plan has been made for home management and has no  complaints prior to discharge. Patient is comfortable with above plan and is stable for discharge at this time. All questions were answered prior to disposition. Results from the ER workup discussed with the patient face to face and all questions answered to the best of my ability. The patient is safe for discharge with strict return precautions. Patient appears safe for discharge with appropriate follow-up. Conveyed my impression with the patient and they voiced understanding and are agreeable to plan.   An After Visit Summary was printed and given to the patient.  Portions of this note were generated with [employer]. Dictation errors may occur despite best attempts at proofreading.   Gabriella Vandenbosch was evaluated in Emergency Department on 08/14/2019 for the symptoms described in the history of present illness. He was evaluated in the context of the global COVID-19 pandemic, which necessitated consideration that the patient might be at risk for infection with the SARS-CoV-2 virus that causes COVID-19. Institutional protocols and algorithms that pertain to the evaluation of patients at risk for COVID-19 are in a state of rapid change based on information released by regulatory bodies including the CDC and federal and state organizations. These policies and algorithms were followed during the patient's care in the ED.   Final Clinical Impression(s) / ED Diagnoses Final diagnoses:  Pain in both knees, unspecified chronicity    Rx / DC Orders ED Discharge Orders         Ordered    methocarbamol (ROBAXIN) 500 MG tablet  2 times daily     08/14/19 1741           Tedd Sias, [place] 08/14/19 1741    Tedd Sias, [place] 08/15/19 0004    Varney Biles, MD 08/20/19 1824

## 2019-08-19 ENCOUNTER — Other Ambulatory Visit: Payer: Self-pay

## 2019-08-19 ENCOUNTER — Encounter (HOSPITAL_COMMUNITY): Payer: Self-pay | Admitting: Emergency Medicine

## 2019-08-19 ENCOUNTER — Emergency Department (HOSPITAL_COMMUNITY)
Admission: EM | Admit: 2019-08-19 | Discharge: 2019-08-19 | Disposition: A | Discharge status: Home / Self Care | Payer: BC Managed Care – PPO | Attending: Emergency Medicine | Admitting: Emergency Medicine

## 2019-08-19 ENCOUNTER — Emergency Department (HOSPITAL_COMMUNITY): Payer: BC Managed Care – PPO

## 2019-08-19 DIAGNOSIS — Z87891 Personal history of nicotine dependence: Secondary | ICD-10-CM | POA: Insufficient documentation

## 2019-08-19 DIAGNOSIS — U071 COVID-19: Secondary | ICD-10-CM

## 2019-08-19 DIAGNOSIS — R0602 Shortness of breath: Secondary | ICD-10-CM | POA: Diagnosis present

## 2019-08-19 DIAGNOSIS — Z79899 Other long term (current) drug therapy: Secondary | ICD-10-CM | POA: Diagnosis not present

## 2019-08-19 DIAGNOSIS — R509 Fever, unspecified: Secondary | ICD-10-CM | POA: Diagnosis not present

## 2019-08-19 LAB — RESPIRATORY PANEL BY RT PCR (FLU A&B, COVID)
Influenza A by PCR: NEGATIVE
Influenza B by PCR: NEGATIVE
SARS Coronavirus 2 by RT PCR: POSITIVE — AB

## 2019-08-19 MED ORDER — ALBUTEROL SULFATE HFA 108 (90 BASE) MCG/ACT IN AERS
1.0000 | INHALATION_SPRAY | RESPIRATORY_TRACT | Status: DC | PRN
  Administered 2019-08-19: 13:00:00 2 via RESPIRATORY_TRACT
  Filled 2019-08-19: qty 6.7

## 2019-08-19 MED ORDER — DEXAMETHASONE SODIUM PHOSPHATE 10 MG/ML IJ SOLN
10.0000 mg | Freq: Once | INTRAMUSCULAR | Status: AC
  Administered 2019-08-19: 10 mg via INTRAMUSCULAR
  Filled 2019-08-19: qty 1

## 2019-08-19 MED ORDER — KETOROLAC TROMETHAMINE 30 MG/ML IJ SOLN
30.0000 mg | Freq: Once | INTRAMUSCULAR | Status: AC
  Administered 2019-08-19: 30 mg via INTRAMUSCULAR
  Filled 2019-08-19: qty 1

## 2019-08-19 MED ORDER — AEROCHAMBER PLUS FLO-VU MEDIUM MISC
1.0000 | Freq: Once | Status: DC
  Filled 2019-08-19: qty 1

## 2019-08-19 MED ORDER — PREDNISONE 10 MG (21) PO TBPK: ORAL_TABLET | ORAL | 0 refills | Status: AC

## 2019-08-19 NOTE — ED Notes (Signed)
Date and time results received: 08/19/19 1:01 PM  (use smartphrase ".now" to insert current time)  Test: Covid Critical Value: + Name of Provider Notified: [place]  Orders Received? Or Actions Taken?: Actions Taken: Dr. Gilford Raid

## 2019-08-19 NOTE — ED Notes (Signed)
ED Provider at bedside. 

## 2019-08-19 NOTE — ED Provider Notes (Signed)
[facility] DEPT Provider Note   CSN: [id number] Arrival date & time: 08/19/19  0909     History Chief Complaint  Patient presents with  . Shortness of Breath  . Fever    Raymond Wells is a 58 y.o. male.  Pt presents to the ED today with sob.  The pt said he was initially tested for covid and the flu on 12/7 by [facility].  He said he was positive for both.  He went back on Thursday, 12/17 and said he was re-swabbed and was negative.  The pt said he still feels sob and has had some fevers.  He is worried he has pneumonia.  The [place] labs are not in our system, so I am not sure which kind of test he had done.        Past Medical History:  Diagnosis Date  . Alcohol abuse   . Drug abuse ([place])   . Hepatitis C FOR 25 YEARS   "TREATED BY [phone]"    Patient Active Problem List   Diagnosis Date Noted  . OSA (obstructive sleep apnea) 07/01/2017  . Hepatitis C 04/04/2014    Past Surgical History:  Procedure Laterality Date  . COLONOSCOPY  2006; 04/22/11, 05/13/2016   2006: hemorrhoids 2012: two diminutive adenomas; 2017 negative  . LIVER BIOPSY         Family History  Problem Relation Age of Onset  . Prostate cancer Maternal Uncle   . Cancer Maternal Uncle   . Alcoholism Mother   . Hypertension Mother   . Alcoholism Father   . Hyperlipidemia Father   . Heart disease Father   . Stroke Father   . Hypertension Father   . Diabetes Maternal Grandmother   . Cancer Maternal Aunt   . Cancer Maternal Uncle   . Cancer Maternal Aunt   . Cancer Maternal Uncle   . Cancer Maternal Uncle   . Colon cancer Neg Hx     Social History   Tobacco Use  . Smoking status: Former Research scientist (life sciences)  . Smokeless tobacco: Never Used  Substance Use Topics  . Alcohol use: No  . Drug use: No    Home Medications Prior to Admission medications   Medication Sig Start Date End Date Taking? Authorizing Provider  methocarbamol  (ROBAXIN) 500 MG tablet Take 1 tablet (500 mg total) by mouth 2 (two) times daily. 08/14/19   Tedd Sias, PA  Multiple Vitamin (MULTIVITAMIN) tablet Take 1 tablet by mouth 3 (three) times a week.     [provider]  predniSONE (STERAPRED UNI-PAK 21 TAB) 10 MG (21) TBPK tablet Take 6 tabs for 2 days, then 5 for 2 days, then 4 for 2 days, then 3 for 2 days, 2 for 2 days, then 1 for 2 days 08/19/19   Isla Pence, MD  tamsulosin (FLOMAX) 0.4 MG CAPS capsule Take 1 capsule (0.4 mg total) by mouth daily. Patient taking differently: Take 0.4 mg by mouth every evening.  11/28/15   Posey Boyer, MD    Allergies    Codeine  Review of Systems   Review of Systems  Respiratory: Positive for cough and shortness of breath.   All other systems reviewed and are negative.   Physical Exam Updated Vital Signs BP (!) 128/92   Pulse 98   Temp 99.1 F (37.3 C) (Oral)   Resp 16   SpO2 95%   Physical Exam Vitals and nursing note reviewed.  Constitutional:  Appearance: He is well-developed.  HENT:     Head: Normocephalic and atraumatic.     Mouth/Throat:     Mouth: Mucous membranes are moist.     Pharynx: Oropharynx is clear.  Eyes:     Extraocular Movements: Extraocular movements intact.     Pupils: Pupils are equal, round, and reactive to light.  Cardiovascular:     Rate and Rhythm: Normal rate and regular rhythm.  Pulmonary:     Effort: Pulmonary effort is normal.     Breath sounds: Normal breath sounds.  Abdominal:     General: Bowel sounds are normal.     Palpations: Abdomen is soft.  Musculoskeletal:        General: Normal range of motion.     Cervical back: Normal range of motion and neck supple.  Skin:    General: Skin is warm.     Capillary Refill: Capillary refill takes less than 2 seconds.  Neurological:     General: No focal deficit present.     Mental Status: He is alert and oriented to person, place, and time.  Psychiatric:        Mood and Affect:  Mood normal.        Behavior: Behavior normal.     ED Results / Procedures / Treatments   Labs (all labs ordered are listed, but only abnormal results are displayed) Labs Reviewed  RESPIRATORY PANEL BY RT PCR (FLU A&B, COVID) - Abnormal; Notable for the following components:      Result Value   SARS Coronavirus 2 by RT PCR POSITIVE (*)    All other components within normal limits    EKG None  Radiology DG Chest Portable 1 View  Result Date: 08/19/2019 CLINICAL DATA:  Shortness of breath, fevers, and fatigue. EXAM: PORTABLE CHEST 1 VIEW COMPARISON:  Chest x-ray dated June 03, 2010. FINDINGS: The heart size and mediastinal contours are within normal limits. Both lungs are clear. The visualized skeletal structures are unremarkable. IMPRESSION: No active disease. Electronically Signed   By: Titus Dubin M.D.   On: 08/19/2019 11:02    Procedures Procedures (including critical care time)  Medications Ordered in ED Medications  albuterol (VENTOLIN HFA) 108 (90 Base) MCG/ACT inhaler 1-2 puff (has no administration in time range)  AeroChamber Plus Flo-Vu Medium MISC 1 each (has no administration in time range)  ketorolac (TORADOL) 30 MG/ML injection 30 mg (30 mg Intramuscular Given 08/19/19 0946)  dexamethasone (DECADRON) injection 10 mg (10 mg Intramuscular Given 08/19/19 0946)    ED Course  I have reviewed the triage vital signs and the nursing notes.  Pertinent labs & imaging results that were available during my care of the patient were reviewed by me and considered in my medical decision making (see chart for details).    MDM Rules/Calculators/A&P                     I repeated another covid swab as there is confusion on whether or not he had covid and he is sob.  His covid test today is +.  He is still symptomatic, so he is unable to go back to work.  Pt is oxygenating well.  Pt is stable for d/c.  Return if worse.  Raymond Wells was evaluated in Emergency Department  on 08/19/2019 for the symptoms described in the history of present illness. He was evaluated in the context of the global COVID-19 pandemic, which necessitated consideration that the patient might be at  risk for infection with the SARS-CoV-2 virus that causes COVID-19. Institutional protocols and algorithms that pertain to the evaluation of patients at risk for COVID-19 are in a state of rapid change based on information released by regulatory bodies including the CDC and federal and state organizations. These policies and algorithms were followed during the patient's care in the ED.   Final Clinical Impression(s) / ED Diagnoses Final diagnoses:  COVID-19 virus infection    Rx / DC Orders ED Discharge Orders         Ordered    predniSONE (STERAPRED UNI-PAK 21 TAB) 10 MG (21) TBPK tablet     08/19/19 1312           Isla Pence, MD 08/19/19 1315

## 2019-08-19 NOTE — ED Triage Notes (Signed)
Pt reports that Thursday he retested at [facility] for Covid and flu and both were negative, since he was positive for both on 12/7.Marland Kitchen   Reports still feels bad, having fevers, SOB, dry cough. Wants chest xray to make sure doesn't have PNA.  Reports his headache never went away.

## 2019-08-19 NOTE — ED Notes (Signed)
Signature pad not working in room, patient verbalized understanding of DC instructions, no questions at this time.

## 2020-05-05 IMAGING — CT CT RENAL STONE PROTOCOL
2 of 4 series · 16 of 46 positions shown, 18 images · non-contrast
Comparison: None.

CLINICAL DATA: Right flank and back pain.

EXAM:
CT ABDOMEN AND PELVIS WITHOUT CONTRAST
TECHNIQUE: Multidetector CT imaging of the abdomen and pelvis was performed
following the standard protocol without IV contrast.

[Series 2: axial st · axial · 0.78mm/px · z∈[-418,-23]mm · 13 of 89 slices shown, 15 images]
[im 5/89  soft-tissue]
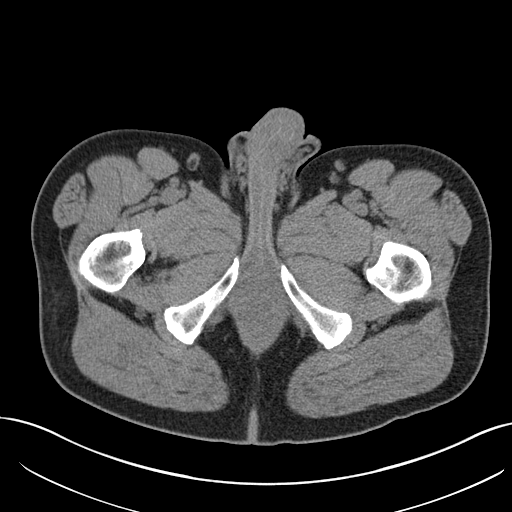
[im 5/89  bone]
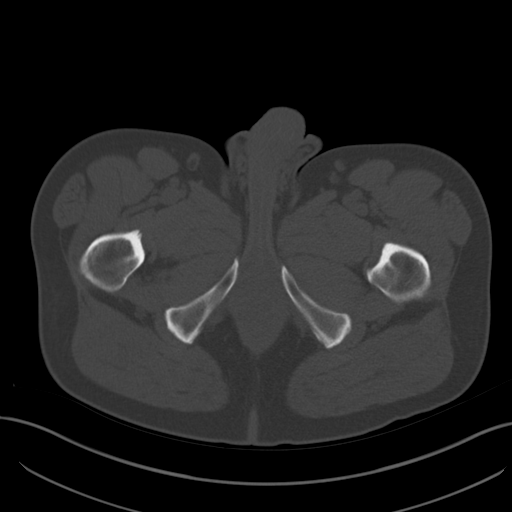
[im 13/89  soft-tissue]
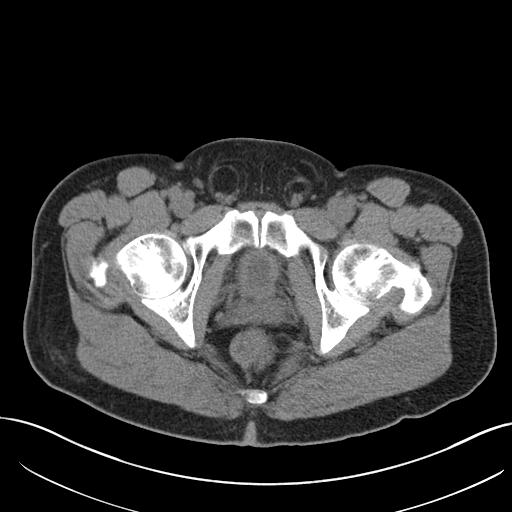
[im 17/89  soft-tissue]
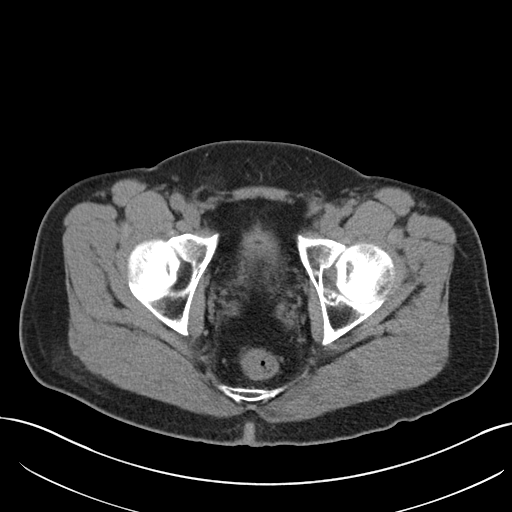
[im 26/89  soft-tissue]
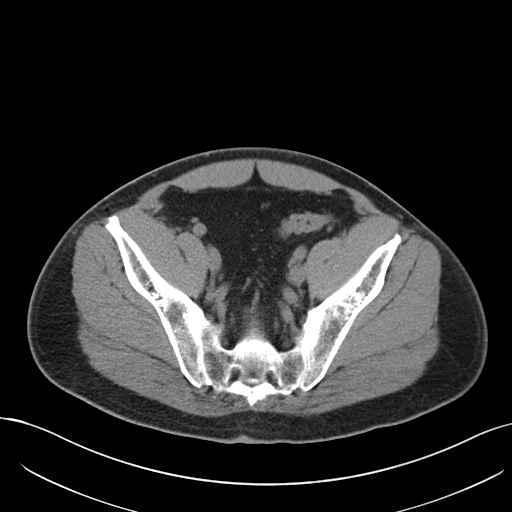
[im 30/89  soft-tissue]
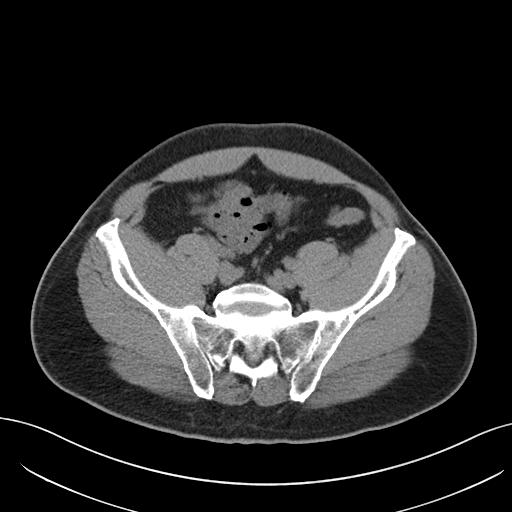
[im 38/89  soft-tissue]
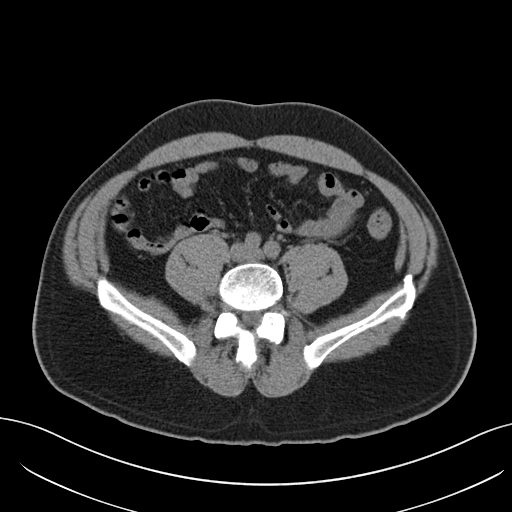
[im 47/89  soft-tissue]
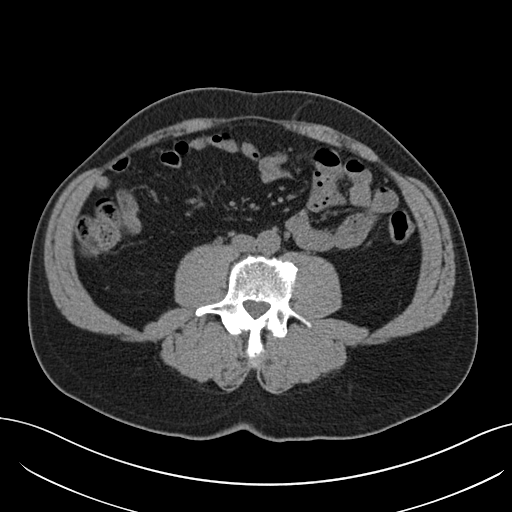
[im 51/89  soft-tissue]
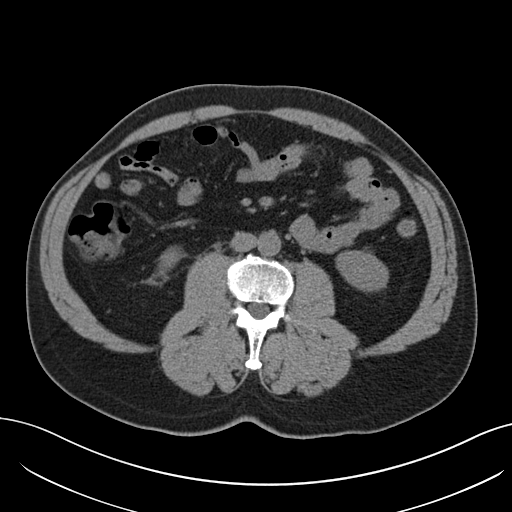
[im 59/89  soft-tissue]
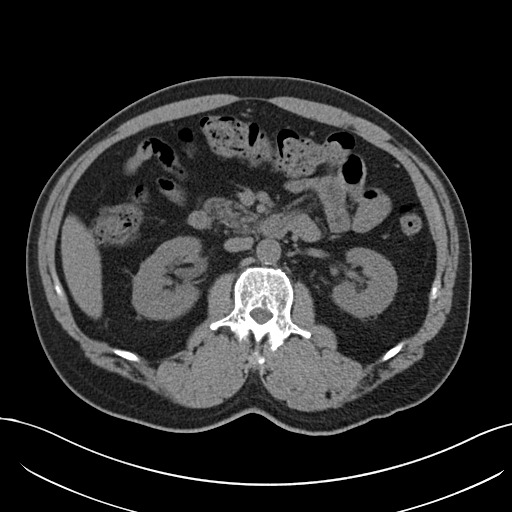
[im 59/89  bone]
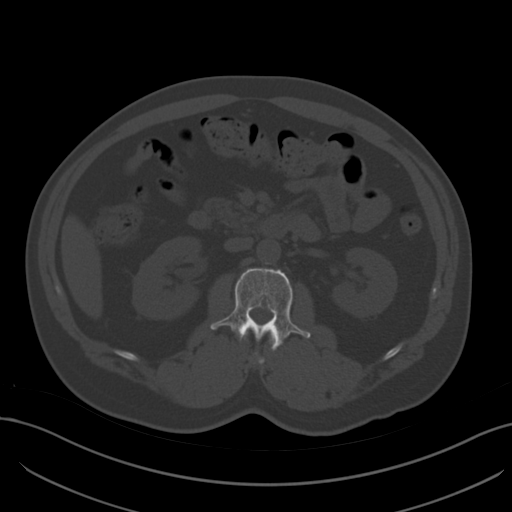
[im 63/89  soft-tissue]
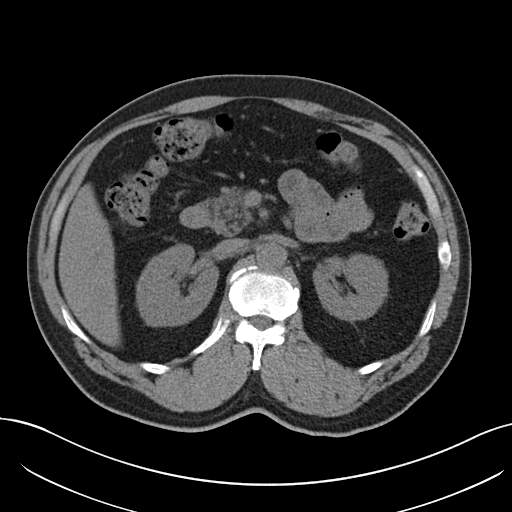
[im 72/89  soft-tissue]
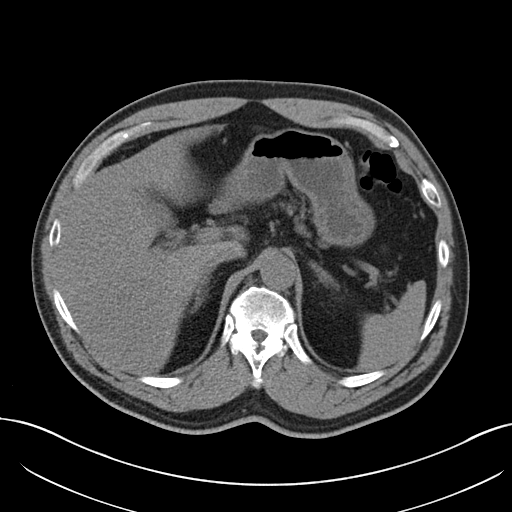
[im 76/89  soft-tissue]
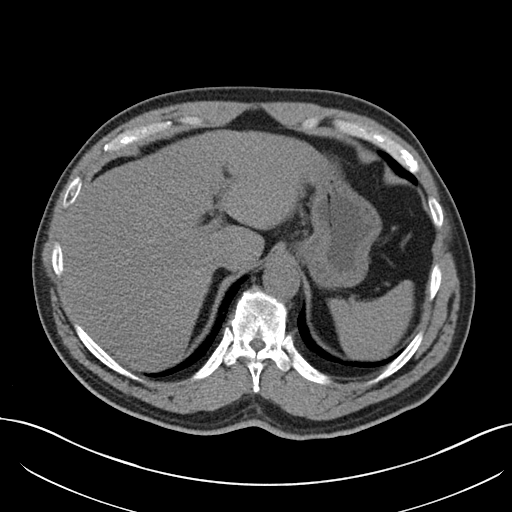
[im 84/89  soft-tissue]
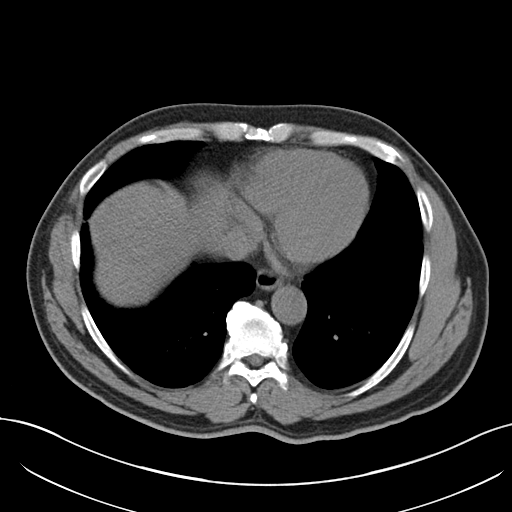

[Series 5: coronal · coronal · 0.72mm/px · 3 of 142 slices shown]
[im 48/142  soft-tissue]
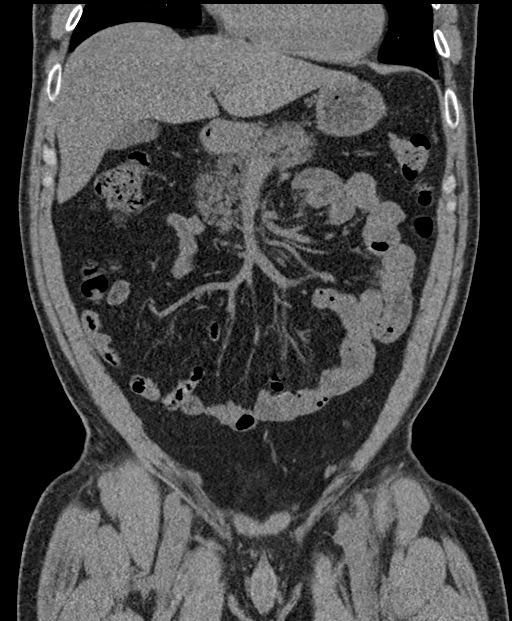
[im 63/142  soft-tissue]
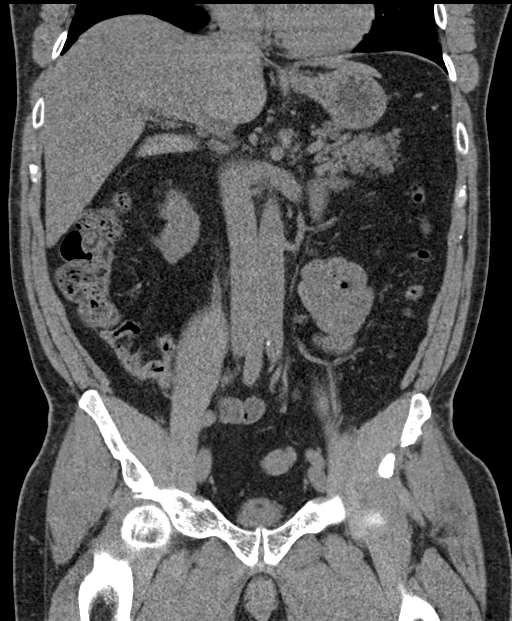
[im 79/142  soft-tissue]
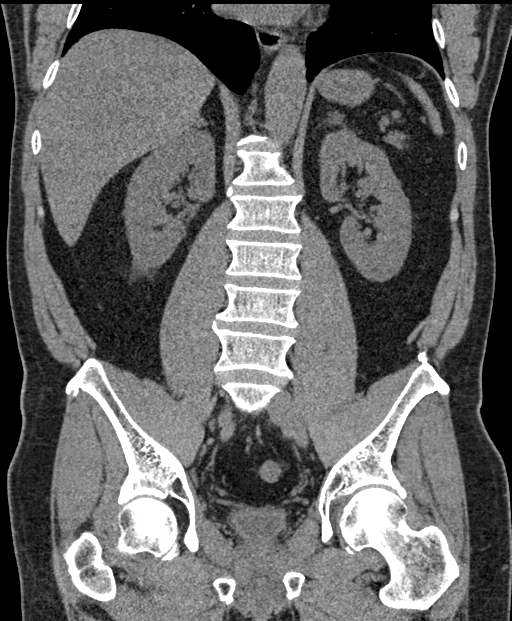

[16 of 46 positions shown; findings below may reference images not displayed]

FINDINGS: Lower chest: No acute abnormality.

Hepatobiliary: No focal liver abnormality is seen. No gallstones,
gallbladder wall thickening, or biliary dilatation.

Pancreas: Unremarkable. No pancreatic ductal dilatation or
surrounding inflammatory changes.

Spleen: Normal in size without focal abnormality.

Adrenals/Urinary Tract: Adrenal glands are unremarkable. Kidneys are
normal, without renal calculi, focal lesion, or hydronephrosis.
Bladder is unremarkable.

Stomach/Bowel: Stomach is within normal limits. No evidence of
appendicitis. No evidence of bowel wall thickening, distention, or
inflammatory changes.

Vascular/Lymphatic: Aortic atherosclerosis, mild. No enlarged
abdominal or pelvic lymph nodes. Small fat containing right inguinal
hernia.

Reproductive: Prostate is unremarkable.

Other: None.

Musculoskeletal: No acute or significant osseous findings.
Spondylosis of the lumbosacral spine.
IMPRESSION: 1. No evidence of acute abnormality within the abdomen or pelvis. No
nephrolithiasis or obstructive uropathy.
2. Mild aortic atherosclerosis.
3. Small fat containing right inguinal hernia.

Aortic Atherosclerosis (POGLO-H48.8).
